# Patient Record
Sex: Female | Born: 1980 | Race: Black or African American | Hispanic: No | Marital: Married | Smoking: Never smoker
Health system: Southern US, Community
[De-identification: ages and names within clinical notes are randomized; demographics above are authoritative.]

---

## 2019-04-05 ENCOUNTER — Other Ambulatory Visit: Payer: Self-pay | Admitting: Obstetrics and Gynecology

## 2019-04-05 DIAGNOSIS — N6310 Unspecified lump in the right breast, unspecified quadrant: Secondary | ICD-10-CM

## 2019-04-22 ENCOUNTER — Other Ambulatory Visit: Payer: Self-pay

## 2019-04-22 ENCOUNTER — Ambulatory Visit
Admission: RE | Admit: 2019-04-22 | Discharge: 2019-04-22 | Disposition: A | Discharge status: Home / Self Care | Payer: BC Managed Care – PPO | Source: Ambulatory Visit | Attending: Obstetrics and Gynecology | Admitting: Obstetrics and Gynecology

## 2019-04-22 ENCOUNTER — Other Ambulatory Visit (HOSPITAL_COMMUNITY): Payer: Self-pay | Admitting: Diagnostic Radiology

## 2019-04-22 ENCOUNTER — Ambulatory Visit
Admission: RE | Admit: 2019-04-22 | Discharge: 2019-04-22 | Disposition: A | Discharge status: Home / Self Care | Payer: Medicaid Other | Source: Ambulatory Visit | Attending: Obstetrics and Gynecology | Admitting: Obstetrics and Gynecology

## 2019-04-22 DIAGNOSIS — N6310 Unspecified lump in the right breast, unspecified quadrant: Secondary | ICD-10-CM

## 2019-11-29 ENCOUNTER — Ambulatory Visit
Admission: EM | Admit: 2019-11-29 | Discharge: 2019-11-29 | Disposition: A | Discharge status: Home / Self Care | Payer: BC Managed Care – PPO | Attending: Emergency Medicine | Admitting: Emergency Medicine

## 2019-11-29 ENCOUNTER — Other Ambulatory Visit: Payer: Self-pay

## 2019-11-29 ENCOUNTER — Encounter: Payer: Self-pay | Admitting: Emergency Medicine

## 2019-11-29 DIAGNOSIS — Z20822 Contact with and (suspected) exposure to covid-19: Secondary | ICD-10-CM

## 2019-11-29 DIAGNOSIS — R110 Nausea: Secondary | ICD-10-CM | POA: Diagnosis not present

## 2019-11-29 DIAGNOSIS — R1084 Generalized abdominal pain: Secondary | ICD-10-CM

## 2019-11-29 DIAGNOSIS — K219 Gastro-esophageal reflux disease without esophagitis: Secondary | ICD-10-CM | POA: Diagnosis not present

## 2019-11-29 MED ORDER — ONDANSETRON HCL 4 MG PO TABS: 4.0000 mg | ORAL_TABLET | Freq: Four times a day (QID) | ORAL | 0 refills | Status: DC

## 2019-11-29 MED ORDER — PANTOPRAZOLE SODIUM 20 MG PO TBEC: 20.0000 mg | DELAYED_RELEASE_TABLET | Freq: Every day | ORAL | 0 refills | Status: DC

## 2019-11-29 NOTE — Discharge Instructions (Signed)
Please read food handout. Take protonix daily x 2 weeks for reflux Take zofran as needed for nausea. Important to drink plenty of water: sip throughout the day.  Your COVID test is pending - it is important to quarantine / isolate at home until your results are back. If you test positive and would like further evaluation for persistent or worsening symptoms, you may schedule an E-visit or virtual (video) visit throughout the [facility] app or website.  PLEASE NOTE: If you develop severe chest pain or shortness of breath please go to the ER or call 9-1-1 for further evaluation --> DO NOT schedule electronic or virtual visits for this. Please call our office for further guidance / recommendations as needed.

## 2019-11-29 NOTE — ED Triage Notes (Addendum)
Pt presents to [facility] for assessment of nausea, diarrhea (x3), generalized abdominal pain x 3 days.  C/o nasal congestion.  Denies dysuria, denies emesis.  C/o severe fatigue.  States she has been out of work, and now needs a note stating she does not have COVID before she can return to work.

## 2019-11-29 NOTE — ED Provider Notes (Signed)
[facility]    CSN: [id number] Arrival date & time: 11/29/19  1525      History   Chief Complaint Chief Complaint  Patient presents with  . Fatigue  . Diarrhea    HPI Jodi Wood is a 39 y.o. female presenting for 3 day course of fatigue, nausea, diarrhea.  Denies fever, known sick contacts, emesis.  Patient does have diffuse generalized abdominal pain, "like when you eat something bad".  Patient does note that her reflux is acting up: Not currently take anything for this.  Patient denies history of gallbladder issues.  Patient able to keep down water, though has not had desire to drink anything.  Endorsing bowel movements whenever she eats which is roughly 3 times daily.  No blood, melena.   History reviewed. No pertinent past medical history.  There are no problems to display for this patient.   Past Surgical History:  Procedure Laterality Date  . CESAREAN SECTION      OB History   No obstetric history on file.      Home Medications    Prior to Admission medications   Medication Sig Start Date End Date Taking? Authorizing Provider  ondansetron (ZOFRAN) 4 MG tablet Take 1 tablet (4 mg total) by mouth every 6 (six) hours. 11/29/19   Hall-Potvin, [place], PA-C  pantoprazole (PROTONIX) 20 MG tablet Take 1 tablet (20 mg total) by mouth daily. 11/29/19   Hall-Potvin, [place], PA-C    Family History Family History  Adopted: Yes    Social History Social History   Tobacco Use  . Smoking status: Never Smoker  . Smokeless tobacco: Never Used  Substance Use Topics  . Alcohol use: Not Currently    Comment: 3 x a year  . Drug use: Never     Allergies   Patient has no known allergies.   Review of Systems Review of Systems  Constitutional: Negative for activity change, appetite change, fatigue and fever.  HENT: Negative for ear pain, sinus pain, sore throat and voice change.   Eyes: Negative for pain, redness and visual disturbance.    Respiratory: Negative for cough and shortness of breath.   Cardiovascular: Negative for chest pain and palpitations.  Gastrointestinal: Positive for abdominal pain, diarrhea and nausea. Negative for abdominal distention, blood in stool, rectal pain and vomiting.       Generalized abdominal pain  Musculoskeletal: Negative for arthralgias and myalgias.  Skin: Negative for rash and wound.  Neurological: Negative for syncope and headaches.     Physical Exam Triage Vital Signs ED Triage Vitals  Enc Vitals Group     BP 11/29/19 1605 128/88     Pulse Rate 11/29/19 1605 95     Resp 11/29/19 1605 16     Temp 11/29/19 1605 97.6 F (36.4 C)     Temp Source 11/29/19 1605 Temporal     SpO2 11/29/19 1605 98 %     Weight --      Height --      Head Circumference --      Peak Flow --      Pain Score 11/29/19 1606 2     Pain Loc --      Pain Edu? --      Excl. in [place]? --    No data found.  Updated Vital Signs BP 128/88 (BP Location: Left Arm)   Pulse 95   Temp 97.6 F (36.4 C) (Temporal)   Resp 16   SpO2 98%  Visual Acuity Right Eye Distance:   Left Eye Distance:   Bilateral Distance:    Right Eye Near:   Left Eye Near:    Bilateral Near:     Physical Exam Constitutional:      General: She is not in acute distress.    Appearance: She is obese. She is not ill-appearing or diaphoretic.  HENT:     Head: Normocephalic and atraumatic.     Mouth/Throat:     Mouth: Mucous membranes are moist.     Pharynx: Oropharynx is clear. No oropharyngeal exudate or posterior oropharyngeal erythema.  Eyes:     General: No scleral icterus.    Conjunctiva/sclera: Conjunctivae normal.     Pupils: Pupils are equal, round, and reactive to light.  Cardiovascular:     Rate and Rhythm: Normal rate and regular rhythm.  Pulmonary:     Effort: Pulmonary effort is normal. No respiratory distress.     Breath sounds: No wheezing.  Abdominal:     General: Bowel sounds are normal. There is no  distension.     Tenderness: There is abdominal tenderness. There is no right CVA tenderness, left CVA tenderness, guarding or rebound.     Comments: Diffuse abdominal pain, worse at right upper quadrant.  Negative Murphy sign, McBurney sign, Rovsing sign.  Skin:    Coloration: Skin is not jaundiced or pale.  Neurological:     Mental Status: She is alert and oriented to person, place, and time.      UC Treatments / Results  Labs (all labs ordered are listed, but only abnormal results are displayed) Labs Reviewed  NOVEL CORONAVIRUS, NAA    EKG   Radiology No results found.  Procedures Procedures (including critical care time)  Medications Ordered in UC Medications - No data to display  Initial Impression / Assessment and Plan / UC Course  I have reviewed the triage vital signs and the nursing notes.  Pertinent labs & imaging results that were available during my care of the patient were reviewed by me and considered in my medical decision making (see chart for details).     Patient afebrile, nontoxic.  Able to keep down water at home.  Will use Zofran for nausea, Protonix for GERD, have patient follow-up with PCP for further evaluation/management if patient develops persistent, worsening right upper quadrant pain.  Low concern for acute cholecystitis at this time.  Patient requesting Covid testing to be able to return to work: PCR pending.  Return precautions discussed, patient verbalized understanding and is agreeable to plan. Final Clinical Impressions(s) / UC Diagnoses   Final diagnoses:  Generalized abdominal pain  Nausea without vomiting  Gastroesophageal reflux disease, unspecified whether esophagitis present     Discharge Instructions     Please read food handout. Take protonix daily x 2 weeks for reflux Take zofran as needed for nausea. Important to drink plenty of water: sip throughout the day.  Your COVID test is pending - it is important to quarantine /  isolate at home until your results are back. If you test positive and would like further evaluation for persistent or worsening symptoms, you may schedule an E-visit or virtual (video) visit throughout the [facility] app or website.  PLEASE NOTE: If you develop severe chest pain or shortness of breath please go to the ER or call 9-1-1 for further evaluation --> DO NOT schedule electronic or virtual visits for this. Please call our office for further guidance / recommendations as needed.  ED Prescriptions    Medication Sig Dispense Auth. Provider   ondansetron (ZOFRAN) 4 MG tablet Take 1 tablet (4 mg total) by mouth every 6 (six) hours. 12 tablet Hall-Potvin, [place], PA-C   pantoprazole (PROTONIX) 20 MG tablet Take 1 tablet (20 mg total) by mouth daily. 30 tablet Hall-Potvin, [place], PA-C     PDMP not reviewed this encounter.   Hall-Potvin, [place], [place] 11/29/19 1630

## 2019-11-30 LAB — NOVEL CORONAVIRUS, NAA: SARS-CoV-2, NAA: NOT DETECTED

## 2020-02-21 ENCOUNTER — Encounter: Payer: Self-pay | Admitting: Emergency Medicine

## 2020-02-21 ENCOUNTER — Ambulatory Visit
Admission: EM | Admit: 2020-02-21 | Discharge: 2020-02-21 | Disposition: A | Discharge status: Home / Self Care | Payer: BC Managed Care – PPO | Attending: Emergency Medicine | Admitting: Emergency Medicine

## 2020-02-21 ENCOUNTER — Other Ambulatory Visit: Payer: Self-pay

## 2020-02-21 DIAGNOSIS — H43393 Other vitreous opacities, bilateral: Secondary | ICD-10-CM | POA: Diagnosis not present

## 2020-02-21 DIAGNOSIS — R519 Headache, unspecified: Secondary | ICD-10-CM | POA: Diagnosis not present

## 2020-02-21 MED ORDER — NAPROXEN 500 MG PO TABS: 500.0000 mg | ORAL_TABLET | Freq: Two times a day (BID) | ORAL | 0 refills | Status: DC

## 2020-02-21 NOTE — Discharge Instructions (Signed)
Keep headache log to review with PCP. Important to establish care for further evaluation and health maintenance. Go to ER for worsening headache, worst headache of life, change in vision or hearing, numbness, tingling, vomiting.

## 2020-02-21 NOTE — ED Provider Notes (Signed)
[facility]    CSN: [id number] Arrival date & time: 02/21/20  1321      History   Chief Complaint Chief Complaint  Patient presents with  . Headache    HPI Jodi Wood is a 39 y.o. female with history of COVID-19 infection presenting for intermittent severe headaches.  States that she was diagnosed with Covid 02/04/2020, developed symptoms 3/16.  Patient having improvement in taste, though still does not have smell.  Patient states that head pain was right-sided, vibratory, and intermittent, though has become more frequent.  States that sometimes it will feel throbbing.  States it is "all over "her head.  Patient states that sometimes she will feel lightheaded.  Pain appears to be random, though consistent with exertion.  Patient endorsing syncopal event, change in hearing, tinnitus, nausea, vomiting.  No numbness, weakness.  Patient denies wearing glasses or contact lenses.  Does endorse change in vision, though has hard time articulating what is different.  Patient does note floaters bilaterally for the last 2 days.  Denies change in visual field, sweeping dark curtain, bright flashes of light.   History reviewed. No pertinent past medical history.  There are no problems to display for this patient.   Past Surgical History:  Procedure Laterality Date  . CESAREAN SECTION      OB History   No obstetric history on file.      Home Medications    Prior to Admission medications   Medication Sig Start Date End Date Taking? Authorizing Provider  naproxen (NAPROSYN) 500 MG tablet Take 1 tablet (500 mg total) by mouth 2 (two) times daily. 02/21/20   Hall-Potvin, [place], PA-C  pantoprazole (PROTONIX) 20 MG tablet Take 1 tablet (20 mg total) by mouth daily. Patient not taking: Reported on 02/21/2020 11/29/19 02/21/20  Hall-Potvin, [place], PA-C    Family History Family History  Adopted: Yes    Social History Social History   Tobacco Use  . Smoking status:  Never Smoker  . Smokeless tobacco: Never Used  Substance Use Topics  . Alcohol use: Not Currently    Comment: 3 x a year  . Drug use: Never     Allergies   Patient has no known allergies.   Review of Systems As per HPI   Physical Exam Triage Vital Signs ED Triage Vitals  Enc Vitals Group     BP      Pulse      Resp      Temp      Temp src      SpO2      Weight      Height      Head Circumference      Peak Flow      Pain Score      Pain Loc      Pain Edu?      Excl. in [place]?    No data found.  Updated Vital Signs BP (!) 157/81 (BP Location: Left Arm)   Pulse (!) 103   Temp 98.8 F (37.1 C) (Oral)   Resp 18   SpO2 99%   Visual Acuity Right Eye Distance:   Left Eye Distance:   Bilateral Distance:    Right Eye Near: R Near: 20/20 Left Eye Near:  L Near: 20/20 Bilateral Near:  20/20  Physical Exam Constitutional:      General: She is not in acute distress.    Appearance: She is well-developed. She is not ill-appearing.  HENT:  Head: Normocephalic and atraumatic.     Right Ear: Tympanic membrane, ear canal and external ear normal.     Left Ear: Tympanic membrane, ear canal and external ear normal.     Mouth/Throat:     Mouth: Mucous membranes are moist.     Pharynx: Oropharynx is clear.  Eyes:     General: No visual field deficit or scleral icterus.    Extraocular Movements: Extraocular movements intact.     Right eye: No nystagmus.     Left eye: No nystagmus.     Conjunctiva/sclera: Conjunctivae normal.     Pupils: Pupils are equal, round, and reactive to light.  Cardiovascular:     Rate and Rhythm: Normal rate and regular rhythm.  Pulmonary:     Effort: Pulmonary effort is normal. No respiratory distress.     Breath sounds: No wheezing.  Musculoskeletal:        General: No deformity. Normal range of motion.     Cervical back: Normal range of motion. No rigidity or tenderness.  Lymphadenopathy:     Cervical: No cervical adenopathy.   Skin:    Capillary Refill: Capillary refill takes less than 2 seconds.     Coloration: Skin is not jaundiced.     Findings: No bruising or rash.  Neurological:     Mental Status: She is alert and oriented to person, place, and time.     Cranial Nerves: Cranial nerves are intact. No cranial nerve deficit, dysarthria or facial asymmetry.     Sensory: Sensation is intact. No sensory deficit.     Motor: Motor function is intact. No weakness.     Coordination: Coordination is intact. Romberg sign negative. Coordination normal.     Gait: Gait is intact. Gait normal.     Deep Tendon Reflexes: Reflexes normal.  Psychiatric:        Mood and Affect: Mood is anxious. Mood is not depressed.        Speech: Speech normal.        Behavior: Behavior normal. Behavior is not agitated.        Cognition and Memory: Cognition is not impaired. Memory is not impaired.     Comments: Patient tearful at times, though consolable.  States she is exhausted.      UC Treatments / Results  Labs (all labs ordered are listed, but only abnormal results are displayed) Labs Reviewed - No data to display  EKG   Radiology No results found.  Procedures Procedures (including critical care time)  Medications Ordered in UC Medications - No data to display  Initial Impression / Assessment and Plan / UC Course  I have reviewed the triage vital signs and the nursing notes.  Pertinent labs & imaging results that were available during my care of the patient were reviewed by me and considered in my medical decision making (see chart for details).     Patient afebrile, nontoxic, and without neurocognitive deficit.  Unsure of etiology of head pain, though question if it is vascular s/p acute COVID-19 infection.  Patient did not PCP to follow-up with.  Reviewed ER return precautions, keeping symptom log, will trial naproxen to see if this is helpful.  Offered work note: Patient declined.  More concerning is sudden onset  of bilateral floaters x2 days.  This provider coordinated care: Patient to present to ophthalmology office straight from urgent care for further evaluation/management.  Patient verbalized understanding of location of office, intends to go straight there.  Return precautions  discussed, patient verbalized understanding and is agreeable to plan. Final Clinical Impressions(s) / UC Diagnoses   Final diagnoses:  Floaters in visual field, bilateral  Nonintractable headache, unspecified chronicity pattern, unspecified headache type     Discharge Instructions     Keep headache log to review with PCP. Important to establish care for further evaluation and health maintenance. Go to ER for worsening headache, worst headache of life, change in vision or hearing, numbness, tingling, vomiting.    ED Prescriptions    Medication Sig Dispense Auth. Provider   naproxen (NAPROSYN) 500 MG tablet Take 1 tablet (500 mg total) by mouth 2 (two) times daily. 30 tablet Hall-Potvin, [place], PA-C     PDMP not reviewed this encounter.   Hall-Potvin, [place], [place] 02/21/20 1427

## 2020-02-21 NOTE — ED Triage Notes (Signed)
Pt here for severe head pain that comes and goes since being diagnosed with covid on 3/20; pt sts worse with activity and she feels lightheaded with pain

## 2021-03-14 ENCOUNTER — Encounter (HOSPITAL_COMMUNITY): Payer: Self-pay

## 2021-03-14 ENCOUNTER — Observation Stay (HOSPITAL_COMMUNITY)
Admission: EM | Admit: 2021-03-14 | Discharge: 2021-03-15 | Disposition: A | Discharge status: Home / Self Care | Payer: BLUE CROSS/BLUE SHIELD | Attending: Internal Medicine | Admitting: Internal Medicine

## 2021-03-14 ENCOUNTER — Other Ambulatory Visit: Payer: Self-pay

## 2021-03-14 ENCOUNTER — Emergency Department (HOSPITAL_COMMUNITY): Payer: BLUE CROSS/BLUE SHIELD

## 2021-03-14 DIAGNOSIS — H543 Unqualified visual loss, both eyes: Secondary | ICD-10-CM | POA: Diagnosis present

## 2021-03-14 DIAGNOSIS — Y900 Blood alcohol level of less than 20 mg/100 ml: Secondary | ICD-10-CM | POA: Diagnosis not present

## 2021-03-14 DIAGNOSIS — G43809 Other migraine, not intractable, without status migrainosus: Principal | ICD-10-CM | POA: Insufficient documentation

## 2021-03-14 DIAGNOSIS — Z20822 Contact with and (suspected) exposure to covid-19: Secondary | ICD-10-CM | POA: Diagnosis not present

## 2021-03-14 DIAGNOSIS — G459 Transient cerebral ischemic attack, unspecified: Secondary | ICD-10-CM

## 2021-03-14 DIAGNOSIS — D649 Anemia, unspecified: Secondary | ICD-10-CM | POA: Diagnosis present

## 2021-03-14 DIAGNOSIS — H547 Unspecified visual loss: Secondary | ICD-10-CM

## 2021-03-14 DIAGNOSIS — H5347 Heteronymous bilateral field defects: Secondary | ICD-10-CM | POA: Diagnosis present

## 2021-03-14 LAB — RAPID URINE DRUG SCREEN, HOSP PERFORMED
Amphetamines: NOT DETECTED
Barbiturates: NOT DETECTED
Benzodiazepines: NOT DETECTED
Cocaine: NOT DETECTED
Opiates: NOT DETECTED
Tetrahydrocannabinol: NOT DETECTED

## 2021-03-14 LAB — DIFFERENTIAL
Abs Immature Granulocytes: 0.02 10*3/uL (ref 0.00–0.07)
Basophils Absolute: 0 10*3/uL (ref 0.0–0.1)
Basophils Relative: 0 %
Eosinophils Absolute: 0.2 10*3/uL (ref 0.0–0.5)
Eosinophils Relative: 3 %
Immature Granulocytes: 0 %
Lymphocytes Relative: 47 %
Lymphs Abs: 3.1 10*3/uL (ref 0.7–4.0)
Monocytes Absolute: 0.6 10*3/uL (ref 0.1–1.0)
Monocytes Relative: 9 %
Neutro Abs: 2.7 10*3/uL (ref 1.7–7.7)
Neutrophils Relative %: 41 %

## 2021-03-14 LAB — CBC
HCT: 31.1 % — ABNORMAL LOW (ref 36.0–46.0)
Hemoglobin: 9.2 g/dL — ABNORMAL LOW (ref 12.0–15.0)
MCH: 17 pg — ABNORMAL LOW (ref 26.0–34.0)
MCHC: 29.6 g/dL — ABNORMAL LOW (ref 30.0–36.0)
MCV: 57.4 fL — ABNORMAL LOW (ref 80.0–100.0)
Platelets: 437 10*3/uL — ABNORMAL HIGH (ref 150–400)
RBC: 5.42 MIL/uL — ABNORMAL HIGH (ref 3.87–5.11)
RDW: 21.1 % — ABNORMAL HIGH (ref 11.5–15.5)
WBC: 6.7 10*3/uL (ref 4.0–10.5)
nRBC: 0 % (ref 0.0–0.2)

## 2021-03-14 LAB — COMPREHENSIVE METABOLIC PANEL
ALT: 16 U/L (ref 0–44)
AST: 19 U/L (ref 15–41)
Albumin: 4.5 g/dL (ref 3.5–5.0)
Alkaline Phosphatase: 59 U/L (ref 38–126)
Anion gap: 12 (ref 5–15)
BUN: 8 mg/dL (ref 6–20)
CO2: 21 mmol/L — ABNORMAL LOW (ref 22–32)
Calcium: 9.4 mg/dL (ref 8.9–10.3)
Chloride: 105 mmol/L (ref 98–111)
Creatinine, Ser: 0.79 mg/dL (ref 0.44–1.00)
GFR, Estimated: >60 mL/min (ref >60)
Glucose, Bld: 95 mg/dL (ref 70–99)
Potassium: 3.4 mmol/L — ABNORMAL LOW (ref 3.5–5.1)
Sodium: 138 mmol/L (ref 135–145)
Total Bilirubin: 0.3 mg/dL (ref 0.3–1.2)
Total Protein: 7.8 g/dL (ref 6.5–8.1)

## 2021-03-14 LAB — ETHANOL: Alcohol, Ethyl (B): <10 mg/dL (ref <10)

## 2021-03-14 LAB — URINALYSIS, ROUTINE W REFLEX MICROSCOPIC
Bilirubin Urine: NEGATIVE
Glucose, UA: NEGATIVE mg/dL
Ketones, ur: NEGATIVE mg/dL
Leukocytes,Ua: NEGATIVE
Nitrite: NEGATIVE
Protein, ur: NEGATIVE mg/dL
Specific Gravity, Urine: 1.005 (ref 1.005–1.030)
pH: 6 (ref 5.0–8.0)

## 2021-03-14 LAB — APTT: aPTT: 31 seconds (ref 24–36)

## 2021-03-14 LAB — RESP PANEL BY RT-PCR (FLU A&B, COVID) ARPGX2
Influenza A by PCR: NEGATIVE
Influenza B by PCR: NEGATIVE
SARS Coronavirus 2 by RT PCR: NEGATIVE

## 2021-03-14 LAB — PROTIME-INR
INR: 0.9 (ref 0.8–1.2)
Prothrombin Time: 12.5 seconds (ref 11.4–15.2)

## 2021-03-14 LAB — HCG, QUANTITATIVE, PREGNANCY: hCG, Beta Chain, Quant, S: <1 m[IU]/mL (ref <5)

## 2021-03-14 MED ORDER — KETOROLAC TROMETHAMINE 30 MG/ML IJ SOLN
15.0000 mg | Freq: Once | INTRAMUSCULAR | Status: AC
  Administered 2021-03-14: 15 mg via INTRAVENOUS
  Filled 2021-03-14: qty 1

## 2021-03-14 NOTE — ED Triage Notes (Signed)
Pt reports left sided vision loss while driving within the last hour. Vision is back to normal now. Pt denies confusion and unilateral weakness. Pt has equal grips, strength and sensation in upper extremities. Left leg seem slightly weaker than the right.

## 2021-03-14 NOTE — ED Provider Notes (Signed)
[facility]-EMERGENCY DEPT Provider Note   CSN: [id number] Arrival date & time: 03/14/21  2015     History Chief Complaint  Patient presents with  . Loss of Vision    Jodi Wood is a 40 y.o. female.  HPI She presents for evaluation of transient vision loss in both eyes, left-sided, associated with right-sided headache which started about 6:30 PM as she was driving home from work.  She states that the headache was initially 7/10 and now is 3/10.  The patient has returned to normal.  Patient was lasted about 45 minutes.  No prior similar problems.  She denies history of migraines.  She denies recent illnesses.  There are no other known modifying factors.    History reviewed. No pertinent past medical history.  There are no problems to display for this patient.   Past Surgical History:  Procedure Laterality Date  . CESAREAN SECTION       OB History   No obstetric history on file.     Family History  Adopted: Yes    Social History   Tobacco Use  . Smoking status: Never Smoker  . Smokeless tobacco: Never Used  Substance Use Topics  . Alcohol use: Not Currently    Comment: 3 x a year  . Drug use: Never    Home Medications Prior to Admission medications   Medication Sig Start Date End Date Taking? Authorizing Provider  naproxen (NAPROSYN) 500 MG tablet Take 1 tablet (500 mg total) by mouth 2 (two) times daily. 02/21/20   Hall-Potvin, [place], PA-C  pantoprazole (PROTONIX) 20 MG tablet Take 1 tablet (20 mg total) by mouth daily. Patient not taking: Reported on 02/21/2020 11/29/19 02/21/20  Hall-Potvin, [place], PA-C    Allergies    Patient has no known allergies.  Review of Systems   Review of Systems  All other systems reviewed and are negative.   Physical Exam Updated Vital Signs BP (!) 154/79 (BP Location: Left Arm)   Pulse 94   Temp 97.9 F (36.6 C) (Oral)   Resp 20   Ht 5' (1.524 m)   Wt 79.4 kg   SpO2 100%   BMI 34.18  kg/m   Physical Exam Vitals and nursing note reviewed.  Constitutional:      General: She is not in acute distress.    Appearance: She is well-developed. She is not ill-appearing, toxic-appearing or diaphoretic.  HENT:     Head: Normocephalic and atraumatic.     Right Ear: External ear normal.     Left Ear: External ear normal.  Eyes:     Conjunctiva/sclera: Conjunctivae normal.     Pupils: Pupils are equal, round, and reactive to light.  Neck:     Trachea: Phonation normal.  Cardiovascular:     Rate and Rhythm: Normal rate and regular rhythm.     Heart sounds: Normal heart sounds.  Pulmonary:     Effort: Pulmonary effort is normal. No respiratory distress.     Breath sounds: No stridor.  Abdominal:     Tenderness: There is no abdominal tenderness.  Musculoskeletal:        General: Normal range of motion.     Cervical back: Normal range of motion and neck supple.  Skin:    General: Skin is warm and dry.  Neurological:     Mental Status: She is alert and oriented to person, place, and time.     Cranial Nerves: No cranial nerve deficit.  Sensory: No sensory deficit.     Motor: No abnormal muscle tone.     Coordination: Coordination normal.  Psychiatric:        Behavior: Behavior normal.        Thought Content: Thought content normal.        Judgment: Judgment normal.     ED Results / Procedures / Treatments   Labs (all labs ordered are listed, but only abnormal results are displayed) Labs Reviewed - No data to display  EKG None  Radiology No results found.  Procedures .Critical Care Performed by: Mancel Bale, MD Authorized by: Mancel Bale, MD   Critical care provider statement:    Critical care time (minutes):  45   Critical care start time:  03/14/2021 8:35 PM   Critical care end time:  03/14/2021 11:46 PM   Critical care time was exclusive of:  Separately billable procedures and treating other patients   Critical care was necessary to treat or  prevent imminent or life-threatening deterioration of the following conditions:  CNS failure or compromise   Critical care was time spent personally by me on the following activities:  Blood draw for specimens, development of treatment plan with patient or surrogate, discussions with consultants, evaluation of patient's response to treatment, examination of patient, obtaining history from patient or surrogate, ordering and performing treatments and interventions, ordering and review of laboratory studies, pulse oximetry, re-evaluation of patient's condition, review of old charts and ordering and review of radiographic studies     Medications Ordered in ED Medications - No data to display  ED Course  I have reviewed the triage vital signs and the nursing notes.  Pertinent labs & imaging results that were available during my care of the patient were reviewed by me and considered in my medical decision making (see chart for details).  Clinical Course as of 03/14/21 2343  Thu Mar 14, 2021  2050 Patient meets criteria for code stroke, transient vision loss left visual field, both eyes, onset 6:30 PM tonight.  Vision loss resolved, headache persists. [EW]    Clinical Course User Index [EW] Mancel Bale, MD   MDM Rules/Calculators/A&P                           Patient Vitals for the past 24 hrs:  BP Temp Temp src Pulse Resp SpO2 Height Weight  03/14/21 2035 (!) 154/79 97.9 F (36.6 C) Oral 94 20 100 % -- --  03/14/21 2019 (!) 141/81 98.4 F (36.9 C) Oral 87 16 100 % 5' (1.524 m) 79.4 kg    11:13 PM Reevaluation with update and discussion. After initial assessment and treatment, an updated evaluation reveals her headache is resolved.  Findings discussed with the patient.Mancel Bale   Medical Decision Making:  This patient is presenting for evaluation of sudden vision loss, left hemianopia, which is require a range of treatment options, and is a complaint that involves a high risk of  morbidity and mortality. The differential diagnoses include CVA, migraine, intraocular disorders. I decided to review old records, and in summary healthy adult female presenting with sudden vision loss, which was transient with persistent headache..  I did not require additional historical information from anyone.  Clinical Laboratory Tests Ordered, included CBC, Metabolic panel and Urinalysis.  Pregnancy test, viral panel, UDS.  Review indicates normal except hemoglobin low, few bacteria in urine, potassium slightly low. Radiologic Tests Ordered, included CT head,.  I independently Visualized:  Radiographic images, which show no acute abnormality  Cardiac Monitor Tracing which shows normal sinus rhythm   Critical Interventions-clinical evaluation, code stroke designation, laboratory testing, imaging, discussion with neuro hospitalist, observation and reassessment  After These Interventions, the Patient was reevaluated and was found with resolution of headache after treatment with Toradol.  Patient with acute onset of symptoms, with differential diagnosis including migraine and CVA.  No evidence for intracranial bleeding.  Headache improved with symptomatic treatment.  Patient is low risk patient for stroke.  Patient requires diagnostic testing with MRI.  Neuro hospitalist has entered recommendations for evaluation which are typical stroke work-up.  Patient agreeable to staying.  CRITICAL CARE-yes Performed by: Mancel Bale  Nursing Notes Reviewed/ Care Coordinated Applicable Imaging Reviewed Interpretation of Laboratory Data incorporated into ED treatment  11:43 PM-Consult complete with hospitalist. Patient case explained and discussed.  She agrees to admit patient for further evaluation and treatment. Call ended at 11:57 PM    Final Clinical Impression(s) / ED Diagnoses Final diagnoses:  Other migraine without status migrainosus, not intractable  Anemia, unspecified type    Rx / DC  Orders ED Discharge Orders    None       Mancel Bale, MD 03/14/21 2359

## 2021-03-14 NOTE — H&P (Signed)
Jodi Wood ZOX:[id number] DOB: Apr 17, 1981 DOA: 03/14/2021     PCP: Patient, No Pcp Per (Inactive)   Outpatient Specialists: NONE    Patient arrived to ER on 03/14/21 at 2015 Referred by Attending Mancel Bale, MD   Patient coming from: home Lives   With family    Chief Complaint:  Chief Complaint  Patient presents with  . Loss of Vision    HPI: Jodi Wood is a 40 y.o. female with medical history significant of anemia    Presented with left-sided vision loss today while driving happened around 811 PM but returned back to normal.  Patient was driving home initially she had a headache in the back of her head then she noted to have a cervical in front of her with everything was blurry followed by blurry vision on the left only followed by complete visual loss on the left and then see a year headache involving right side of her head and right face no loss of sensation in the face.  No nausea or vomiting no photophobia no loss of sensation or strength in arms or legs.  She could not look in the mirror to see if she had facial droop.    Lasted for 45 minutes.  And then resolved no history of prior migraines. No prior history of strokes Patient adopted does not know her family history.   Reports known history of heavy menstrual bleeding and anemia for which she takes iron supplements but not always able to take them secondary to causing GI distress  Initial COVID TEST  NEGATIVE   Lab Results  Component Value Date   SARSCOV2NAA NEGATIVE 03/14/2021   SARSCOV2NAA Not Detected 11/29/2019    Regarding pertinent Chronic problems:  Chronic anemia - baseline hg Hemoglobin & Hematocrit  Recent Labs    03/14/21 2045  HGB 9.2*    While in ER: Initially code stroke was initiated CT head nonacute Was seen in consult by neurology tele specialist recommended headache cocktail and MRI neurology was to follow-up    ED Triage Vitals  Enc Vitals Group     BP 03/14/21  2019 (!) 141/81     Pulse Rate 03/14/21 2019 87     Resp 03/14/21 2019 16     Temp 03/14/21 2019 98.4 F (36.9 C)     Temp Source 03/14/21 2019 Oral     SpO2 03/14/21 2019 100 %     Weight 03/14/21 2019 175 lb (79.4 kg)     Height 03/14/21 2019 5' (1.524 m)     Head Circumference --      Peak Flow --      Pain Score 03/14/21 2031 8     Pain Loc --      Pain Edu? --      Excl. in GC? --   TMAX(24)@     _________________________________________ Significant initial  Findings: Abnormal Labs Reviewed  CBC - Abnormal; Notable for the following components:      Result Value   RBC 5.42 (*)    Hemoglobin 9.2 (*)    HCT 31.1 (*)    MCV 57.4 (*)    MCH 17.0 (*)    MCHC 29.6 (*)    RDW 21.1 (*)    Platelets 437 (*)    All other components within normal limits  COMPREHENSIVE METABOLIC PANEL - Abnormal; Notable for the following components:   Potassium 3.4 (*)    CO2 21 (*)    All other  components within normal limits  URINALYSIS, ROUTINE W REFLEX MICROSCOPIC - Abnormal; Notable for the following components:   Color, Urine STRAW (*)    Hgb urine dipstick SMALL (*)    Bacteria, UA FEW (*)    All other components within normal limits   ____________________________________________  CT HEAD   NON acute  CXR - Ordered    ECG: Ordered Personally reviewed by me showing: HR : 78 Rhythm: NSR,    , no evidence of ischemic changes QTC 442   The recent clinical data is shown below. Vitals:   03/14/21 2200 03/14/21 2230 03/14/21 2300 03/14/21 2330  BP: 111/73 113/73 121/82 112/71  Pulse: 71 71 71 78  Resp: Temp:      TempSrc:      SpO2: 100% 100% 100% 100%  Weight:      Height:       WBC     Component Value Date/Time   WBC 6.7 03/14/2021 2045   LYMPHSABS 3.1 03/14/2021 2045   MONOABS 0.6 03/14/2021 2045   EOSABS 0.2 03/14/2021 2045   BASOSABS 0.0 03/14/2021 2045     UA   no evidence of UTI    Urine analysis:    Component Value Date/Time   COLORURINE  STRAW (A) 03/14/2021 2144   APPEARANCEUR CLEAR 03/14/2021 2144   LABSPEC 1.005 03/14/2021 2144   PHURINE 6.0 03/14/2021 2144   GLUCOSEU NEGATIVE 03/14/2021 2144   HGBUR SMALL (A) 03/14/2021 2144   BILIRUBINUR NEGATIVE 03/14/2021 2144   KETONESUR NEGATIVE 03/14/2021 2144   PROTEINUR NEGATIVE 03/14/2021 2144   NITRITE NEGATIVE 03/14/2021 2144   LEUKOCYTESUR NEGATIVE 03/14/2021 2144    Results for orders placed or performed during the hospital encounter of 03/14/21  Resp Panel by RT-PCR (Flu A&B, Covid) Nasopharyngeal Swab     Status: None   Collection Time: 03/14/21  8:45 PM   Specimen: Nasopharyngeal Swab; Nasopharyngeal(NP) swabs in vial transport medium  Result Value Ref Range Status   SARS Coronavirus 2 by RT PCR NEGATIVE NEGATIVE Final         Influenza A by PCR NEGATIVE NEGATIVE Final   Influenza B by PCR NEGATIVE NEGATIVE Final          _______________________________________________________ ER Provider Called: Tele  neurology    Dr. Cheral Bay They Recommend admit to medicine  MRI and stroke work up Will see in AM   _______________________________________________ Hospitalist was called for admission for transient loss of vision on left side  The following Work up has been ordered so far:  Orders Placed This Encounter  Procedures  . Critical Care  . Resp Panel by RT-PCR (Flu A&B, Covid) Nasopharyngeal Swab  . CT HEAD CODE STROKE WO CONTRAST  . Ethanol  . Protime-INR  . APTT  . CBC  . Differential  . Comprehensive metabolic panel  . Urine rapid drug screen (hosp performed)  . Urinalysis, Routine w reflex microscopic  . hCG, quantitative, pregnancy  . Diet NPO time specified  . Vital signs  . Cardiac Monitoring  . Modified Stroke Scale (mNIHSS) Document mNIHSS assessment every 2 hours for a total of 12 hours  . Stroke swallow screen  . Initiate Carrier Fluid Protocol  . If O2 sat If O2 Sat < 94%, administer O2 at 2 liters/minute via nasal cannula.  [place] to  Activate Code Stroke Call [facility] [phone]  . Consult to hospitalist  . Pulse oximetry, continuous  . I-stat chem 8, ED  . I-Stat  beta hCG blood, ED  . ED EKG  . EKG 12-Lead  . Saline lock IV    Following Medications were ordered in ER: Medications  ketorolac (TORADOL) 30 MG/ML injection 15 mg (15 mg Intravenous Given 03/14/21 2135)        Consult Orders  (From admission, onward)         Start     Ordered   03/14/21 2341  Consult to hospitalist  Once       Provider:  (Not yet assigned)  Question Answer Comment  Place call to: [facility]   Reason for Consult Admit      03/14/21 2340            OTHER Significant initial  Findings:  labs showing:    Recent Labs  Lab 03/14/21 2045  NA 138  K 3.4*  CO2 21*  GLUCOSE 95  BUN 8  CREATININE 0.79  CALCIUM 9.4    Cr  Stable  Lab Results  Component Value Date   CREATININE 0.79 03/14/2021    Recent Labs  Lab 03/14/21 2045  AST 19  ALT 16  ALKPHOS 59  BILITOT 0.3  PROT 7.8  ALBUMIN 4.5   Lab Results  Component Value Date   CALCIUM 9.4 03/14/2021          Plt: Lab Results  Component Value Date   PLT 437 (H) 03/14/2021    Recent Labs  Lab 03/14/21 2045  WBC 6.7  NEUTROABS 2.7  HGB 9.2*  HCT 31.1*  MCV 57.4*  PLT 437*    HG/HCT     Component Value Date/Time   HGB 9.2 (L) 03/14/2021 2045   HCT 31.1 (L) 03/14/2021 2045   MCV 57.4 (L) 03/14/2021 2045    Radiological Exams on Admission: CT HEAD CODE STROKE WO CONTRAST  Result Date: 03/14/2021 CLINICAL DATA:  Code stroke. Initial evaluation for acute left-sided visual loss. EXAM: CT HEAD WITHOUT CONTRAST TECHNIQUE: Contiguous axial images were obtained from the base of the skull through the vertex without intravenous contrast. COMPARISON:  None. FINDINGS: Brain: Cerebral volume within normal limits for patient age. No evidence for acute intracranial hemorrhage. No findings to suggest acute large vessel territory infarct. No mass  lesion, midline shift, or mass effect. Ventricles are normal in size without evidence for hydrocephalus. No extra-axial fluid collection identified. 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 Vascular: No hyperdense vessel identified. Skull: Scalp soft tissues demonstrate no acute abnormality. Calvarium intact. Sinuses/Orbits: Globes and orbital soft tissues within normal limits. Visualized paranasal sinuses are clear. No mastoid effusion. ASPECTS [facility] Stroke Program Early CT Score) - Ganglionic level infarction (caudate, lentiform nuclei, internal capsule, insula, M1-M3 cortex): 7 - Supraganglionic infarction (M4-M6 cortex): 3 Total score (0-10 with 10 being normal): 10 IMPRESSION: 1. No acute intracranial abnormality. 2. ASPECTS is 10. 3. Chiari 1 malformation. Critical Value/emergent results were called by telephone at the time of interpretation on 03/14/2021 at 9:11 pm to provider [facility] , who verbally acknowledged these results. Electronically Signed   By: Rise Mu M.D.   On: 03/14/2021 21:11   _______________________________________________________________________________________________________ Latest  Blood pressure 112/71, pulse 78, temperature 97.9 F (36.6 C), temperature source Oral, resp. rate 18, height 5' (1.524 m), weight 79.4 kg, SpO2 100 %.   Review of Systems:    Pertinent positives include: Loss of vision left eye and a headache  Constitutional:  No weight loss, night sweats, Fevers, chills, fatigue, weight loss  HEENT:   , Difficulty swallowing,Tooth/dental problems,Sore throat,  No sneezing, itching, ear ache, nasal congestion, post nasal drip,  Cardio-vascular:  No chest pain, Orthopnea, PND, anasarca, dizziness, palpitations.no Bilateral lower extremity swelling  GI:  No heartburn, indigestion, abdominal pain, nausea, vomiting, diarrhea, change in bowel habits, loss of appetite, melena, blood in stool,  hematemesis Resp:  no shortness of breath at rest. No dyspnea on exertion, No excess mucus, no productive cough, No non-productive cough, No coughing up of blood.No change in color of mucus.No wheezing. Skin:  no rash or lesions. No jaundice GU:  no dysuria, change in color of urine, no urgency or frequency. No straining to urinate.  No flank pain.  Musculoskeletal:  No joint pain or no joint swelling. No decreased range of motion. No back pain.  Psych:  No change in mood or affect. No depression or anxiety. No memory loss.  Neuro: , no tingling, no weakness, no double vision, no gait abnormality, no slurred speech, no confusion  All systems reviewed and apart from HOPI all are negative _______________________________________________________________________________________________ Past Medical History:  History reviewed. No pertinent past medical history.    Past Surgical History:  Procedure Laterality Date  . CESAREAN SECTION      Social History:  Ambulatory  independently       reports that she has never smoked. She has never used smokeless tobacco. She reports previous alcohol use. She reports that she does not use drugs.    Family History:   Family History  Adopted: Yes   ______________________________________________________________________________________________ Allergies: No Known Allergies   Prior to Admission medications   Medication Sig Start Date End Date Taking? Authorizing Provider  ibuprofen (ADVIL) 200 MG tablet Take 400 mg by mouth every 6 (six) hours as needed for mild pain.   Yes [provider]  Multiple Vitamin (MULTIVITAMIN WITH MINERALS) TABS tablet Take 1 tablet by mouth daily.   Yes [provider]  naproxen (NAPROSYN) 500 MG tablet Take 1 tablet (500 mg total) by mouth 2 (two) times daily. Patient not taking: No sig reported 02/21/20   Hall-Potvin, [place], PA-C  pantoprazole (PROTONIX) 20 MG tablet Take 1 tablet (20 mg total)  by mouth daily. Patient not taking: Reported on 02/21/2020 11/29/19 02/21/20  Hall-Potvin, [place], PA-C    ___________________________________________________________________________________________________ Physical Exam: Vitals with BMI 03/14/2021 03/14/2021 03/14/2021  Height - - -  Weight - - -  BMI - - -  Systolic 112 121 250  Diastolic 71 82 73  Pulse 78 71 71     1. General:  in No  Acute distress   well  -appearing 2. Psychological: Alert and   Oriented 3. Head/ENT:      Dry Mucous Membranes                          Head Non traumatic, neck supple                           Poor Dentition 4. SKIN:  decreased Skin turgor,  Skin clean Dry and intact no rash 5. Heart: Regular rate and rhythm no  Murmur, no Rub or gallop 6. Lungs:   no wheezes or crackles   7. Abdomen: Soft,  non-tender, Non distended  bowel sounds present 8. Lower extremities: no clubbing, cyanosis, no edema 9. Neurologically strength 5 out of 5 in all 4 extremities cranial nerves II through XII intact 10. MSK: Normal  range of motion    Chart has been reviewed  ______________________________________________________________________________________________  Assessment/Plan  40 y.o. female with medical history significant of anemia    Admitted for left side visual loss possible TIA versus migraine  Present on Admission:  . Hemianopsia -differential diagnosis includes TIA/stroke versus migraine.  Appreciate neurology consult.  Currently headache resolved.  - will admit based on TIA/CVA protocol,        Monitor on Tele       MRA/MRI await  results           Carotid Doppler        Echo to evaluate for possible embolic source,        obtain cardiac enzymes,  ECG,   Lipid panel, TSH.        Order PT/OT evaluation.        keep nothing by mouth until passes swallow eval   when does restart regular diet              Allow permissive Hypertension keep BP <220/120        Neurology consulted Have seen pt will see  in AM   . Anemia - - Most likely cause of Anemia  heavy menses -Obtain anemia panel - Hemoccult stool 3 - Check TSH Patient has been taking over-the-counter iron supplements but has not been compliant encourage compliance will need to follow-up with OB/GYN   Other plan as per orders.  DVT prophylaxis:  SCD   Code Status:    Code Status: Not on file FULL CODE  as per patient   I had personally discussed CODE STATUS with patient    Family Communication:   Family not at  Bedside    Disposition Plan:        To home once workup is complete and patient is stable   Following barriers for discharge:                            TIA/Stroke work up completed                           Will need consultants to evaluate patient prior to discharge                                        Consults called: neurology   Admission status:  ED Disposition    ED Disposition Condition Comment   Admit  Hospital Area: [facility] [[id number]]  Level of Care: Telemetry [5]  Admit to tele based on following criteria: Other see comments  Comments: tia  Covid Evaluation: Confirmed COVID Negative  Diagnosis: Loss of vision [[id number]]  Admitting Physician: Therisa Doyne [[id number]]  Attending Physician: Therisa Doyne [[id number]]       Obs   Level of care   tele   indefinitely please discontinue once patient no longer qualifies COVID-19 Labs    Lab Results  Component Value Date   SARSCOV2NAA NEGATIVE 03/14/2021     Precautions: admitted as Covid Negative     PPE: Used by the provider:   N95   eye Goggles,  Gloves     Turkessa Ostrom 03/14/2021, 12:41 AM    [facility]     after 2 AM please page floor coverage PA If 7AM-7PM, please contact the day team taking  care of the patient using [email or web address]   Patient was evaluated in the context of the global COVID-19 pandemic, which necessitated consideration that the patient might be at risk for infection with the  SARS-CoV-2 virus that causes COVID-19. Institutional protocols and algorithms that pertain to the evaluation of patients at risk for COVID-19 are in a state of rapid change based on information released by regulatory bodies including the CDC and federal and state organizations. These policies and algorithms were followed during the patient's care.

## 2021-03-14 NOTE — Consult Note (Signed)
[facility] Tele[facility]urology Consult Services   Date of Service:   03/14/2021 20:59:34  Diagnosis:     .  H53.8 - Blurred Vision  Impression:     . 40 year old female presents with transient visual disturbance and headache. Suspect likely migrainous in nature due to the duration of symptoms closely followed by headache although she has never been formally diagnosed with migraine and has never had auras in the past. Therefore I would recommend headache cocktails in the ER, tox metabolic work-up, and admission for an MRI scan. Neuro follow-up.  Metrics: Last Known Well: 03/14/2021 18:30:00 [facility] Notification Time: 03/14/2021 20:59:34 Arrival Time: 03/14/2021 20:15:00 Stamp Time: 03/14/2021 20:59:34 Initial Response Time: 03/14/2021 21:01:13 Symptoms: Transient visual disturbance and headache. NIHSS Start Assessment Time: 03/14/2021 21:05:47 Patient is not a candidate for Thrombolytic. Thrombolytic Medical Decision: 03/14/2021 21:09:49 Patient was not deemed candidate for Thrombolytic because of following reasons: Resolved symptoms (no residual disabling symptoms). Other Diagnosis suspected.  CT head showed no acute hemorrhage or acute core infarct.  ED Physician notified of diagnostic impression and management plan on 03/14/2021 21:13:26  Advanced Imaging: Advanced Imaging Not Recommended because:  Clinical presentation is not suggestion of LVO or Low clinical suspicion of LVO based on presentation   Our recommendations are outlined below.  Recommendations:      .  Stroke/Telemetry Floor     .  Neuro Checks     .  Bedside Swallow Eval     .  DVT Prophylaxis     .  IV Fluids, Normal Saline     .  Head of Bed 30 Degrees     .  Euglycemia and Avoid Hyperthermia (PRN Acetaminophen)     .  Toxic/metabolic/infx workup per ED including UA, UDS     .  MRI brain without IV contrast     .  MRA head/neck     .  TSH, A1c, lipid profile     .   Transthoracic echocardiogram     .  Continuous telemetry     .  Physical, occupational, and speech therapies     .  q4h neuro checks/NIHSS     .  NPO until bedside swallow     .  Neurology follow-up  Routine Consultation with Inhouse Neurology for Follow up Care  Sign Out:     .  Discussed with Emergency Department Provider    ------------------------------------------------------------------------------  History of Present Illness: Patient is a 40 year old Female.  Patient was brought by private transportation with symptoms of Transient visual disturbance and headache.  40 year old female presents to the hospital with transient visual disturbance and headache. She reports that around 6:30 PM this evening she began having hemifield blurred vision on the left without clear vision loss. No scintillating scotomas. No distortions. Was present in both eyes on the left side. Lasted about 45 minutes and then resolved and was soon followed by a right frontal headache. She does have a history of headache although has never been diagnosed with migraine. She denies any other neurological symptoms. Her examination currently is nonfocal. She has no visual field cut currently.   Anticoagulant use:  No  Antiplatelet use: No  Allergies:  NKDA    Examination: BP(154/79), Pulse(94), Blood Glucose(/) 1A: Level of Consciousness - Alert; keenly responsive + 0 1B: Ask Month and Age - Both Questions Right + 0 1C: Blink Eyes & Squeeze Hands - Performs Both Tasks + 0 2: Test Horizontal Extraocular Movements - Normal + 0  3: Test Visual Fields - No Visual Loss + 0 4: Test Facial Palsy (Use Grimace if Obtunded) - Normal symmetry + 0 5A: Test Left Arm Motor Drift - No Drift for 10 Seconds + 0 5B: Test Right Arm Motor Drift - No Drift for 10 Seconds + 0 6A: Test Left Leg Motor Drift - No Drift for 5 Seconds + 0 6B: Test Right Leg Motor Drift - No Drift for 5 Seconds + 0 7: Test Limb Ataxia  (FNF/Heel-Shin) - No Ataxia + 0 8: Test Sensation - Normal; No sensory loss + 0 9: Test Language/Aphasia - Normal; No aphasia + 0 10: Test Dysarthria - Normal + 0 11: Test Extinction/Inattention - No abnormality + 0  NIHSS Score: 0   Pre-Morbid Modified Rankin Scale: 0 Points = No symptoms at all   Patient/Family was informed the Neurology Consult would occur via TeleHealth consult by way of interactive audio and video telecommunications and consented to receiving care in this manner.   Patient is being evaluated for possible acute neurologic impairment and high probability of imminent or life-threatening deterioration. I spent total of 35 minutes providing care to this patient, including time for face to face visit via telemedicine, review of medical records, imaging studies and discussion of findings with providers, the patient and/or family.   Dr Lacie Scotts   [facility] [phone]  Case [id number]

## 2021-03-14 NOTE — ED Notes (Signed)
Per Dr Effie Shy will provide update re: contacting [facility] for transfer. Elsie Ra RN advised. [employer]

## 2021-03-14 NOTE — H&P (Incomplete)
Jodi Wood WUJ:[id number] DOB: 18-Jul-1981 DOA: 03/14/2021     PCP: Patient, No Pcp Per (Inactive)   Outpatient Specialists: NONE    Patient arrived to ER on 03/14/21 at 2015 Referred by Attending Mancel Bale, MD   Patient coming from: home Lives alone,   *** With family    Chief Complaint:  Chief Complaint  Patient presents with  . Loss of Vision    HPI: Jodi Wood is a 40 y.o. female with medical history significant of ***     Presented with left-sided vision loss today while driving happened around 956 PM but returned back to normal.  This was associated with a headache.  Lasted for 45 minutes.  No history of prior migraines. No prior history of strokes   Infectious risk factors:  Reports*** fever, shortness of breath, dry cough, chest pain, Sore throat, URI symptoms, anosmia/change in taste, N/V/Diarrhea/abdominal pain,  Body aches, severe fatigue Reports known sick contacts, known COVID 19 exposure, inability to completely isolate     Has ***NOt been vaccinated against COVID **and boosted   Initial COVID TEST  NEGATIVE   Lab Results  Component Value Date   SARSCOV2NAA NEGATIVE 03/14/2021   SARSCOV2NAA Not Detected 11/29/2019     Regarding pertinent Chronic problems:  Chronic anemia - baseline hg Hemoglobin & Hematocrit  Recent Labs    03/14/21 2045  HGB 9.2*    While in ER: Initially code stroke was initiated CT head nonacute Was seen in consult by neurology tele specialist recommended headache cocktail and MRI neurology was to follow-up    ED Triage Vitals  Enc Vitals Group     BP 03/14/21 2019 (!) 141/81     Pulse Rate 03/14/21 2019 87     Resp 03/14/21 2019 16     Temp 03/14/21 2019 98.4 F (36.9 C)     Temp Source 03/14/21 2019 Oral     SpO2 03/14/21 2019 100 %     Weight 03/14/21 2019 175 lb (79.4 kg)     Height 03/14/21 2019 5' (1.524 m)     Head Circumference --      Peak Flow --      Pain Score 03/14/21 2031 8      Pain Loc --      Pain Edu? --      Excl. in GC? --   TMAX(24)@     _________________________________________ Significant initial  Findings: Abnormal Labs Reviewed  CBC - Abnormal; Notable for the following components:      Result Value   RBC 5.42 (*)    Hemoglobin 9.2 (*)    HCT 31.1 (*)    MCV 57.4 (*)    MCH 17.0 (*)    MCHC 29.6 (*)    RDW 21.1 (*)    Platelets 437 (*)    All other components within normal limits  COMPREHENSIVE METABOLIC PANEL - Abnormal; Notable for the following components:   Potassium 3.4 (*)    CO2 21 (*)    All other components within normal limits  URINALYSIS, ROUTINE W REFLEX MICROSCOPIC - Abnormal; Notable for the following components:   Color, Urine STRAW (*)    Hgb urine dipstick SMALL (*)    Bacteria, UA FEW (*)    All other components within normal limits   ____________________________________________ Ordered CT HEAD *** NON acute  CXR - ***NON acute  CTabd/pelvis - ***nonacute  CTA chest - ***nonacute, no PE, * no evidence of infiltrate  _________________________ Troponin ***ordered ECG: Ordered Personally reviewed by me showing: HR : *** Rhythm: *NSR, Sinus tachycardia * A.fib. W RVR, RBBB, LBBB, Paced Ischemic changes*nonspecific changes, no evidence of ischemic changes QTC* ____________________ This patient meets SIRS Criteria and may be septic. SIRS = Systemic Inflammatory Response Syndrome  Order a lactic acid level if needed AND/OR Initiate the sepsis protocol with the attached order set OR Click "Treating Associated Infection or Illness" if the patient is being treated for an infection that is a known cause of these abnormalities     The recent clinical data is shown below. Vitals:   03/14/21 2200 03/14/21 2230 03/14/21 2300 03/14/21 2330  BP: 111/73 113/73 121/82 112/71  Pulse: 71 71 71 78  Resp: 15 15 15 18   Temp:      TempSrc:      SpO2: 100% 100% 100% 100%  Weight:      Height:            WBC      Component Value Date/Time   WBC 6.7 03/14/2021 2045   LYMPHSABS 3.1 03/14/2021 2045   MONOABS 0.6 03/14/2021 2045   EOSABS 0.2 03/14/2021 2045   BASOSABS 0.0 03/14/2021 2045        Lactic Acid, Venous No results found for: LATICACIDVEN   Procalcitonin *** Ordered Lactic Acid, Venous No results found for: LATICACIDVEN   Procalcitonin *** Ordered   UA *** no evidence of UTI  ***Pending ***not ordered   Urine analysis:    Component Value Date/Time   COLORURINE STRAW (A) 03/14/2021 2144   APPEARANCEUR CLEAR 03/14/2021 2144   LABSPEC 1.005 03/14/2021 2144   PHURINE 6.0 03/14/2021 2144   GLUCOSEU NEGATIVE 03/14/2021 2144   HGBUR SMALL (A) 03/14/2021 2144   BILIRUBINUR NEGATIVE 03/14/2021 2144   KETONESUR NEGATIVE 03/14/2021 2144   PROTEINUR NEGATIVE 03/14/2021 2144   NITRITE NEGATIVE 03/14/2021 2144   LEUKOCYTESUR NEGATIVE 03/14/2021 2144    Results for orders placed or performed during the hospital encounter of 03/14/21  Resp Panel by RT-PCR (Flu A&B, Covid) Nasopharyngeal Swab     Status: None   Collection Time: 03/14/21  8:45 PM   Specimen: Nasopharyngeal Swab; Nasopharyngeal(NP) swabs in vial transport medium  Result Value Ref Range Status   SARS Coronavirus 2 by RT PCR NEGATIVE NEGATIVE Final    Comment: (NOTE) SARS-CoV-2 target nucleic acids are NOT DETECTED.  The SARS-CoV-2 RNA is generally detectable in upper respiratory specimens during the acute phase of infection. The lowest concentration of SARS-CoV-2 viral copies this assay can detect is 138 copies/mL. A negative result does not preclude SARS-Cov-2 infection and should not be used as the sole basis for treatment or other patient management decisions. A negative result may occur with  improper specimen collection/handling, submission of specimen other than nasopharyngeal swab, presence of viral mutation(s) within the areas targeted by this assay, and inadequate number of viral copies(<138 copies/mL). A  negative result must be combined with clinical observations, patient history, and epidemiological information. The expected result is Negative.  Fact Sheet for Patients:  03/16/21  Fact Sheet for Healthcare Providers:  BloggerCourse.com  This test is no t yet approved or cleared by the [place] FDA and  has been authorized for detection and/or diagnosis of SARS-CoV-2 by FDA under an Emergency Use Authorization (EUA). This EUA will remain  in effect (meaning this test can be used) for the duration of the COVID-19 declaration under Section 564(b)(1) of the Act, 21 U.S.C.section 360bbb-3(b)(1), unless the authorization  is terminated  or revoked sooner.       Influenza A by PCR NEGATIVE NEGATIVE Final   Influenza B by PCR NEGATIVE NEGATIVE Final    Comment: (NOTE) The Xpert Xpress SARS-CoV-2/FLU/RSV plus assay is intended as an aid in the diagnosis of influenza from Nasopharyngeal swab specimens and should not be used as a sole basis for treatment. Nasal washings and aspirates are unacceptable for Xpert Xpress SARS-CoV-2/FLU/RSV testing.  Fact Sheet for Patients: BloggerCourse.com  Fact Sheet for Healthcare Providers: [employer]ious[employer]ker.it  This test is not yet approved or cleared by the [place] FDA and has been authorized for detection and/or diagnosis of SARS-CoV-2 by FDA under an Emergency Use Authorization (EUA). This EUA will remain in effect (meaning this test can be used) for the duration of the COVID-19 declaration under Section 564(b)(1) of the Act, 21 U.S.C. section 360bbb-3(b)(1), unless the authorization is terminated or revoked.  Performed at [facility], [address] [place], [place] [address]      _______________________________________________________ ER Provider Called:     DrMarland Kitchen  They Recommend admit to medicine  *** Will see in AM  ***SEEN in ER _______________________________________________ Hospitalist was called for admission for ***  The following Work up has been ordered so far:  Orders Placed This Encounter  Procedures  . Critical Care  . Resp Panel by RT-PCR (Flu A&B, Covid) Nasopharyngeal Swab  . CT HEAD CODE STROKE WO CONTRAST  . Ethanol  . Protime-INR  . APTT  . CBC  . Differential  . Comprehensive metabolic panel  . Urine rapid drug screen (hosp performed)  . Urinalysis, Routine w reflex microscopic  . hCG, quantitative, pregnancy  . Diet NPO time specified  . Vital signs  . Cardiac Monitoring  . Modified Stroke Scale (mNIHSS) Document mNIHSS assessment every 2 hours for a total of 12 hours  . Stroke swallow screen  . Initiate Carrier Fluid Protocol  . If O2 sat If O2 Sat < 94%, administer O2 at 2 liters/minute via nasal cannula.  [place] to Activate Code Stroke Call [facility] [phone]  . Consult to hospitalist  . Pulse oximetry, continuous  . I-stat chem 8, ED  . I-Stat beta hCG blood, ED  . ED EKG  . EKG 12-Lead  . Saline lock IV      Following Medications were ordered in ER: Medications  ketorolac (TORADOL) 30 MG/ML injection 15 mg (15 mg Intravenous Given 03/14/21 2135)        Consult Orders  (From admission, onward)         Start     Ordered   03/14/21 2341  Consult to hospitalist  Once       Provider:  (Not yet assigned)  Question Answer Comment  Place call to: [facility]   Reason for Consult Admit      03/14/21 2340            OTHER Significant initial  Findings:  labs showing:    Recent Labs  Lab 03/14/21 2045  NA 138  K 3.4*  CO2 21*  GLUCOSE 95  BUN 8  CREATININE 0.79  CALCIUM 9.4    Cr  * stable,  Up from baseline see below Lab Results  Component Value Date   CREATININE 0.79 03/14/2021    Recent Labs  Lab 03/14/21 2045  AST 19  ALT 16  ALKPHOS 59  BILITOT 0.3  PROT 7.8  ALBUMIN 4.5   Lab Results   Component Value  Date   CALCIUM 9.4 03/14/2021          Plt: Lab Results  Component Value Date   PLT 437 (H) 03/14/2021       COVID-19 Labs  No results for input(s): DDIMER, FERRITIN, LDH, CRP in the last 72 hours.  Lab Results  Component Value Date   SARSCOV2NAA NEGATIVE 03/14/2021   SARSCOV2NAA Not Detected 11/29/2019     Arterial ***Venous  Blood Gas result:  pH *** pCO2 ***; pO2 ***;     %O2 Sat ***.  ABG No results found for: PHART, PCO2ART, PO2ART, HCO3, TCO2, ACIDBASEDEF, O2SAT       Recent Labs  Lab 03/14/21 2045  WBC 6.7  NEUTROABS 2.7  HGB 9.2*  HCT 31.1*  MCV 57.4*  PLT 437*    HG/HCT * stable,  Down *Up from baseline see below    Component Value Date/Time   HGB 9.2 (L) 03/14/2021 2045   HCT 31.1 (L) 03/14/2021 2045   MCV 57.4 (L) 03/14/2021 2045      No results for input(s): LIPASE, AMYLASE in the last 168 hours. No results for input(s): AMMONIA in the last 168 hours.   Cardiac Panel (last 3 results) No results for input(s): CKTOTAL, CKMB, TROPONINI, RELINDX in the last 72 hours.   BNP (last 3 results) No results for input(s): BNP in the last 8760 hours.    DM  labs:  HbA1C: No results for input(s): HGBA1C in the last 8760 hours.     CBG (last 3)  No results for input(s): GLUCAP in the last 72 hours.        Cultures: No results found for: SDES, SPECREQUEST, CULT, REPTSTATUS   Radiological Exams on Admission: CT HEAD CODE STROKE WO CONTRAST  Result Date: 03/14/2021 CLINICAL DATA:  Code stroke. Initial evaluation for acute left-sided visual loss. EXAM: CT HEAD WITHOUT CONTRAST TECHNIQUE: Contiguous axial images were obtained from the base of the skull through the vertex without intravenous contrast. COMPARISON:  None. FINDINGS: Brain: Cerebral volume within normal limits for patient age. No evidence for acute intracranial hemorrhage. No findings to suggest acute large vessel territory infarct. No mass lesion, midline shift, or  mass effect. Ventricles are normal in size without evidence for hydrocephalus. No extra-axial fluid collection identified. 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 Vascular: No hyperdense vessel identified. Skull: Scalp soft tissues demonstrate no acute abnormality. Calvarium intact. Sinuses/Orbits: Globes and orbital soft tissues within normal limits. Visualized paranasal sinuses are clear. No mastoid effusion. ASPECTS [facility] Stroke Program Early CT Score) - Ganglionic level infarction (caudate, lentiform nuclei, internal capsule, insula, M1-M3 cortex): 7 - Supraganglionic infarction (M4-M6 cortex): 3 Total score (0-10 with 10 being normal): 10 IMPRESSION: 1. No acute intracranial abnormality. 2. ASPECTS is 10. 3. Chiari 1 malformation. Critical Value/emergent results were called by telephone at the time of interpretation on 03/14/2021 at 9:11 pm to provider [facility] , who verbally acknowledged these results. Electronically Signed   By: Rise Mu M.D.   On: 03/14/2021 21:11   _______________________________________________________________________________________________________ Latest  Blood pressure 112/71, pulse 78, temperature 97.9 F (36.6 C), temperature source Oral, resp. rate 18, height 5' (1.524 m), weight 79.4 kg, SpO2 100 %.   Review of Systems:    Pertinent positives include: ***  Constitutional:  No weight loss, night sweats, Fevers, chills, fatigue, weight loss  HEENT:  No headaches, Difficulty swallowing,Tooth/dental problems,Sore throat,  No sneezing, itching, ear ache, nasal congestion, post nasal  drip,  Cardio-vascular:  No chest pain, Orthopnea, PND, anasarca, dizziness, palpitations.no Bilateral lower extremity swelling  GI:  No heartburn, indigestion, abdominal pain, nausea, vomiting, diarrhea, change in bowel habits, loss of appetite, melena, blood in stool, hematemesis Resp:  no shortness of breath at  rest. No dyspnea on exertion, No excess mucus, no productive cough, No non-productive cough, No coughing up of blood.No change in color of mucus.No wheezing. Skin:  no rash or lesions. No jaundice GU:  no dysuria, change in color of urine, no urgency or frequency. No straining to urinate.  No flank pain.  Musculoskeletal:  No joint pain or no joint swelling. No decreased range of motion. No back pain.  Psych:  No change in mood or affect. No depression or anxiety. No memory loss.  Neuro: no localizing neurological complaints, no tingling, no weakness, no double vision, no gait abnormality, no slurred speech, no confusion  All systems reviewed and apart from HOPI all are negative _______________________________________________________________________________________________ Past Medical History:  History reviewed. No pertinent past medical history.    Past Surgical History:  Procedure Laterality Date  . CESAREAN SECTION      Social History:  Ambulatory *** independently cane, walker  wheelchair bound, bed bound     reports that she has never smoked. She has never used smokeless tobacco. She reports previous alcohol use. She reports that she does not use drugs.     Family History: *** Family History  Adopted: Yes   ______________________________________________________________________________________________ Allergies: No Known Allergies   Prior to Admission medications   Medication Sig Start Date End Date Taking? Authorizing Provider  ibuprofen (ADVIL) 200 MG tablet Take 400 mg by mouth every 6 (six) hours as needed for mild pain.   Yes [provider]  Multiple Vitamin (MULTIVITAMIN WITH MINERALS) TABS tablet Take 1 tablet by mouth daily.   Yes [provider]  naproxen (NAPROSYN) 500 MG tablet Take 1 tablet (500 mg total) by mouth 2 (two) times daily. Patient not taking: No sig reported 02/21/20   Hall-Potvin, [place], PA-C  pantoprazole (PROTONIX) 20  MG tablet Take 1 tablet (20 mg total) by mouth daily. Patient not taking: Reported on 02/21/2020 11/29/19 02/21/20  Hall-Potvin, [place], PA-C    ___________________________________________________________________________________________________ Physical Exam: Vitals with BMI 03/14/2021 03/14/2021 03/14/2021  Height - - -  Weight - - -  BMI - - -  Systolic 112 121 [id number]  Diastolic 71 82 73  Pulse 78 71 71     1. General:  in No ***Acute distress***increased work of breathing ***complaining of severe pain****agitated * Chronically ill *well *cachectic *toxic acutely ill -appearing 2. Psychological: Alert and *** Oriented 3. Head/ENT:   Moist *** Dry Mucous Membranes                          Head Non traumatic, neck supple                          Normal *** Poor Dentition 4. SKIN: normal *** decreased Skin turgor,  Skin clean Dry and intact no rash 5. Heart: Regular rate and rhythm no*** Murmur, no Rub or gallop 6. Lungs: ***Clear to auscultation bilaterally, no wheezes or crackles   7. Abdomen: Soft, ***non-tender, Non distended *** obese ***bowel sounds present 8. Lower extremities: no clubbing, cyanosis, no ***edema 9. Neurologically Grossly intact, moving all 4 extremities equally *** strength 5 out of 5 in all 4 extremities  cranial nerves II through XII intact 10. MSK: Normal range of motion    Chart has been reviewed  ______________________________________________________________________________________________  Assessment/Plan  ***  Admitted for ***  Present on Admission: **None**   Other plan as per orders.  DVT prophylaxis:  SCD *** Lovenox       Code Status:    Code Status: Not on file FULL CODE *** DNR/DNI ***comfort care as per patient ***family  I had personally discussed CODE STATUS with patient and family* I had spent *min discussing goals of care and CODE STATUS    Family Communication:   Family not at  Bedside  plan of care was discussed on the phone  with *** Son, Daughter, Wife, Husband, Sister, Brother , father, mother  Disposition Plan:   *** likely will need placement for rehabilitation                          Back to current facility when stable                            To home once workup is complete and patient is stable  ***Following barriers for discharge:                            Electrolytes corrected                               Anemia corrected                             Pain controlled with PO medications                               Afebrile, white count improving able to transition to PO antibiotics                             Will need to be able to tolerate PO                            Will likely need home health, home O2, set up                           Will need consultants to evaluate patient prior to discharge  ****EXPECT DC tomorrow                    ***Would benefit from PT/OT eval prior to DC  Ordered                   Swallow eval - SLP ordered                   Diabetes care coordinator                   Transition of care consulted                   Nutrition    consulted                  Wound care  consulted  Palliative care    consulted                   Behavioral health  consulted                    Consults called: ***    Admission status:  ED Disposition    None       Obs***  ***  inpatient     I Expect 2 midnight stay secondary to severity of patient's current illness need for inpatient interventions justified by the following: ***hemodynamic instability despite optimal treatment (tachycardia *hypotension * tachypnea *hypoxia, hypercapnia) * Severe lab/radiological/exam abnormalities including:     and extensive comorbidities including: *substance abuse  *Chronic pain *DM2  * CHF * CAD  * COPD/asthma *Morbid Obesity * CKD *dementia *liver disease *history of stroke with residual deficits *  malignancy, * sickle cell disease  . History of  amputation . Chronic anticoagulation  That are currently affecting medical management.   I expect  patient to be hospitalized for 2 midnights requiring inpatient medical care.  Patient is at high risk for adverse outcome (such as loss of life or disability) if not treated.  Indication for inpatient stay as follows:  Severe change from baseline regarding mental status Hemodynamic instability despite maximal medical therapy,  ongoing suicidal ideations,  severe pain requiring acute inpatient management,  inability to maintain oral hydration   persistent chest pain despite medical management Need for operative/procedural  intervention New or worsening hypoxia  Need for IV antibiotics, IV fluids, IV rate controling medications, IV antihypertensives, IV pain medications, IV anticoagulation    Level of care   *** tele  For 12H 24H     medical floor       SDU tele indefinitely please discontinue once patient no longer qualifies COVID-19 Labs    Lab Results  Component Value Date   SARSCOV2NAA NEGATIVE 03/14/2021     Precautions: admitted as *** Covid Negative  ***asymptomatic screening protocol****PUI *** covid positive No active isolations ***If Covid PCR is negative  - please DC precautions - would need additional investigation given very high risk for false native test result   PPE: Used by the provider:   N95***P100  eye Goggles,  Gloves ***gown     Critical***  Patient is critically ill due to  hemodynamic instability * respiratory failure *severe sepsis* ongoing chest pain*  They are at high risk for life/limb threatening clinical deterioration requiring frequent reassessment and modifications of care.  Services provided include examination of the patient, review of relevant ancillary tests, prescription of lifesaving therapies, review of medications and prophylactic therapy.  Total critical care time excluding separately billable procedures: 60*   Minutes.    Keimya Briddell 03/14/2021, 11:51 PM ***  [facility]     after 2 AM please page floor coverage PA If 7AM-7PM, please contact the day team taking care of the patient using [email or web address]   Patient was evaluated in the context of the global COVID-19 pandemic, which necessitated consideration that the patient might be at risk for infection with the SARS-CoV-2 virus that causes COVID-19. Institutional protocols and algorithms that pertain to the evaluation of patients at risk for COVID-19 are in a state of rapid change based on information released by regulatory bodies including the CDC and federal and state organizations. These policies and algorithms were followed during the patient's care.

## 2021-03-14 NOTE — ED Notes (Signed)
IV placed and Blood work obtained and sent to lab.

## 2021-03-15 ENCOUNTER — Observation Stay (HOSPITAL_COMMUNITY): Payer: BLUE CROSS/BLUE SHIELD

## 2021-03-15 ENCOUNTER — Observation Stay (HOSPITAL_BASED_OUTPATIENT_CLINIC_OR_DEPARTMENT_OTHER): Payer: BLUE CROSS/BLUE SHIELD

## 2021-03-15 DIAGNOSIS — D500 Iron deficiency anemia secondary to blood loss (chronic): Secondary | ICD-10-CM

## 2021-03-15 DIAGNOSIS — H547 Unspecified visual loss: Secondary | ICD-10-CM | POA: Diagnosis not present

## 2021-03-15 DIAGNOSIS — D649 Anemia, unspecified: Secondary | ICD-10-CM | POA: Diagnosis present

## 2021-03-15 DIAGNOSIS — H5347 Heteronymous bilateral field defects: Secondary | ICD-10-CM | POA: Diagnosis present

## 2021-03-15 DIAGNOSIS — G459 Transient cerebral ischemic attack, unspecified: Secondary | ICD-10-CM

## 2021-03-15 DIAGNOSIS — Q282 Arteriovenous malformation of cerebral vessels: Secondary | ICD-10-CM | POA: Diagnosis not present

## 2021-03-15 LAB — LIPID PANEL
Cholesterol: 142 mg/dL (ref 0–200)
HDL: 51 mg/dL (ref >40)
LDL Cholesterol: 83 mg/dL (ref 0–99)
Total CHOL/HDL Ratio: 2.8 RATIO
Triglycerides: 40 mg/dL (ref <150)
VLDL: 8 mg/dL (ref 0–40)

## 2021-03-15 LAB — RETICULOCYTES
Immature Retic Fract: 13.8 % (ref 2.3–15.9)
RBC.: 5.14 MIL/uL — ABNORMAL HIGH (ref 3.87–5.11)
Retic Count, Absolute: 35 10*3/uL (ref 19.0–186.0)
Retic Ct Pct: 0.7 % (ref 0.4–3.1)

## 2021-03-15 LAB — FOLATE: Folate: 13.9 ng/mL (ref >5.9)

## 2021-03-15 LAB — HEMOGLOBIN A1C
Hgb A1c MFr Bld: 5.6 % (ref 4.8–5.6)
Mean Plasma Glucose: 114.02 mg/dL

## 2021-03-15 LAB — VITAMIN B12: Vitamin B-12: 277 pg/mL (ref 180–914)

## 2021-03-15 LAB — ECHOCARDIOGRAM COMPLETE
Area-P 1/2: 3.03 cm2
Height: 60 in
S' Lateral: 2.5 cm
Weight: 2800 oz

## 2021-03-15 LAB — IRON AND TIBC
Iron: 18 ug/dL — ABNORMAL LOW (ref 28–170)
Saturation Ratios: 4 % — ABNORMAL LOW (ref 10.4–31.8)
TIBC: 426 ug/dL (ref 250–450)
UIBC: 408 ug/dL

## 2021-03-15 LAB — HIV ANTIBODY (ROUTINE TESTING W REFLEX): HIV Screen 4th Generation wRfx: NONREACTIVE

## 2021-03-15 LAB — FERRITIN: Ferritin: 5 ng/mL — ABNORMAL LOW (ref 11–307)

## 2021-03-15 LAB — CK: Total CK: 76 U/L (ref 38–234)

## 2021-03-15 MED ORDER — CYANOCOBALAMIN 1000 MCG PO TABS: 1000.0000 ug | ORAL_TABLET | Freq: Every day | ORAL | 0 refills | Status: AC

## 2021-03-15 MED ORDER — SODIUM CHLORIDE 0.9 % IV SOLN: INTRAVENOUS | Status: DC

## 2021-03-15 MED ORDER — ACETAMINOPHEN 325 MG PO TABS
650.0000 mg | ORAL_TABLET | ORAL | Status: DC | PRN
  Administered 2021-03-15: 650 mg via ORAL
  Filled 2021-03-15: qty 2

## 2021-03-15 MED ORDER — FERROUS SULFATE 325 (65 FE) MG PO TBEC: 325.0000 mg | DELAYED_RELEASE_TABLET | Freq: Every day | ORAL | 0 refills | Status: AC

## 2021-03-15 MED ORDER — ACETAMINOPHEN 650 MG RE SUPP: 650.0000 mg | RECTAL | Status: DC | PRN

## 2021-03-15 MED ORDER — CYANOCOBALAMIN 1000 MCG/ML IJ SOLN
1000.0000 ug | Freq: Once | INTRAMUSCULAR | Status: AC
  Administered 2021-03-15: 1000 ug via INTRAMUSCULAR
  Filled 2021-03-15: qty 1

## 2021-03-15 MED ORDER — STROKE: EARLY STAGES OF RECOVERY BOOK
Freq: Once | Status: DC
  Filled 2021-03-15: qty 1

## 2021-03-15 MED ORDER — SODIUM CHLORIDE 0.9 % IV SOLN
510.0000 mg | Freq: Once | INTRAVENOUS | Status: AC
  Administered 2021-03-15: 510 mg via INTRAVENOUS
  Filled 2021-03-15: qty 510

## 2021-03-15 MED ORDER — ACETAMINOPHEN 160 MG/5ML PO SOLN: 650.0000 mg | ORAL | Status: DC | PRN

## 2021-03-15 MED ORDER — VITAMIN B-12 1000 MCG PO TABS: 1000.0000 ug | ORAL_TABLET | Freq: Every day | ORAL | Status: DC

## 2021-03-15 MED ORDER — IOHEXOL 350 MG/ML SOLN
75.0000 mL | Freq: Once | INTRAVENOUS | Status: AC | PRN
  Administered 2021-03-15: 75 mL via INTRAVENOUS

## 2021-03-15 MED ORDER — IBUPROFEN 200 MG PO TABS: 400.0000 mg | ORAL_TABLET | Freq: Three times a day (TID) | ORAL | Status: DC | PRN

## 2021-03-15 NOTE — Progress Notes (Signed)
Spoke with Lamar Laundry RN about imaging of MRA.

## 2021-03-15 NOTE — Progress Notes (Signed)
  Echocardiogram 2D Echocardiogram has been performed.  Jodi Wood 03/15/2021, 3:42 PM

## 2021-03-15 NOTE — Progress Notes (Signed)
Unable to run MRA due to iron supplementation given prior to MRI. Images attempted too much artifact for MRA to be read out. Spoke with Dr Genia Hotter radiologist. MRI without completed.

## 2021-03-15 NOTE — Progress Notes (Signed)
OT Cancellation Note  Patient Details Name: Jodi Wood MRN: [id number] DOB: 23-Oct-1981   Cancelled Treatment:    Reason Eval/Treat Not Completed: OT screened, no needs identified, will sign off. Patient reports all visual deficits have resolved. Reports no physical or sensation impairments. Reports ability to ambulate to bathroom and perform all tasks as usual. OT evaluation not indicated at this time.  Jodi Wood L Jerre Vandrunen 03/15/2021, 4:10 PM

## 2021-03-15 NOTE — Progress Notes (Signed)
Carotid duplex has been completed.   Preliminary results in CV Proc.   Blanch Media 03/15/2021 8:36 AM

## 2021-03-15 NOTE — Discharge Summary (Signed)
Discharge Summary  Jodi Wood IZT:[id number] DOB: [birth year]  PCP: Patient, No Pcp Per (Inactive)  Admit date: 03/14/2021 Discharge date: 03/15/2021  Time spent: 35 mins  Recommendations for Outpatient Follow-up:  1. Patient to establish care with PCP as she just recently moved to [facility] 2. Follow-up with neurology, neuro interventional   Discharge Diagnoses:  Active Hospital Problems   Diagnosis Date Noted  . Loss of vision 03/15/2021  . Anemia 03/15/2021  . Hemianopsia 03/15/2021    Resolved Hospital Problems  No resolved problems to display.    Discharge Condition: Stable  Diet recommendation: Heart healthy  Vitals:   03/15/21 1643 03/15/21 1930  BP: 130/80 134/82  Pulse: 90 76  Resp: 16 18  Temp:    SpO2: 100% 100%    History of present illness:  40 year-old female presents with transient loss of left-sided vision loss while driving on 3/82/5053.  Patient initially reported some blurry vision, which progressed to left-sided vision loss that lasted for about 30 minutes and fully recovered vision by the time she came to the ER.  Patient also reported unilateral headache, which she reports intermittently happens, feels like pain is at the back of her eyes.  Patient usually has frequent bad headaches but has never been diagnosed with migraines.  Patient denies any nausea/vomiting, denies any loss of sensation, extremity weakness or facial droop.  No prior history of strokes, or this event ever happening.  Patient was adopted and does not know her family history.  Patient denies any other medical issues except for heavy menstrual periods with associated anemia.  Unable to tolerate oral iron supplements.  In the ED, patient remained completely asymptomatic, vital signs stable, labs fairly stable except for hemoglobin of 9.2, iron 18, sats 4, ferritin 5, vitamin B12 277.  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  Neuro interventional consultation is recommended and catheter-based angiography should be considered for further evaluation.  No intracranial large vessel occlusion or proximal high-grade arterial stenosis.  Discussed with neurologist Dr. Selina Cooley, who will see patient this evening and possibly recommend outpatient neuro interventional consultation as patient is very eager to be discharged.  Patient refused to be admitted upstairs.     Today, patient denies any further left-sided vision loss, denies any significant headaches, nausea/vomiting, chest pain, shortness of breath, abdominal pain, fever/chills.  Patient very upset about being in the hospital this long and imaging not done quickly.  Patient expressed extreme frustration about her hospital experience and wants to be discharged today.  I discussed extensively with patient for the need to see at least a neurologist before she can be discharged due to the results of her MRI/CTA.  Patient agreed to see a neurologist, but would like to go home afterwards.  Discussed with Dr. Darlyn Read from neurology, who graciously has agreed to see patient this evening in the ER and would arrange follow-up with a neurointervention as an outpatient.    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  No intracranial large vessel occlusion or proximal high-grade arterial stenosis Echo with normal EF Carotid Doppler with no abnormality Neurointerventional consultation is recommended and catheter-based angiography  should be considered for further evaluation Discussed with neurologist Dr. Selina Cooley, will see patient in consultation and plan for outpatient neuro interventional consultation as patient is very eager to be discharged Advised close follow-up with neurology and neuro interventional  Hypokalemia Replaced as needed  Iron deficiency anemia Vitamin B12 deficiency Patient has a history of menorrhagia Anemia panel showed iron 18, sats 4, ferritin 5, vitamin B12-->277, folate 13.9 Has had follow-up with OB/GYN/PCP 1 dose of Feraheme given on 03/15/2021 Continue oral iron and Vit B12 supplementation  Obesity Lifestyle modification advised       Estimated body mass index is 34.18 kg/m as calculated from the following:   Height as of this encounter: 5' (1.524 m).   Weight as of this encounter: 79.4 kg.    Procedures:  None  Consultations: Neurology   Discharge Exam: BP 134/82   Pulse 76   Temp 97.9 F (36.6 C) (Oral)   Resp 18   Ht 5' (1.524 m)   Wt 79.4 kg   LMP  (LMP Unknown)   SpO2 100%   BMI 34.18 kg/m   General: NAD Cardiovascular: S1, S2 present Respiratory: CTA B Neurology: Strength equal in all extremities, vision intact, sensation intact, no obvious focal neurologic deficits noted     Discharge Instructions You were cared for by a hospitalist during your hospital stay. If you have any questions about your discharge medications or the care you received while you were in the hospital after you are discharged, you can call the unit and asked to speak with the hospitalist on call if the hospitalist that took care of you is not available. Once you are discharged, your primary care physician will handle any further medical issues. Please note that NO REFILLS for any discharge medications will be authorized once you are discharged, as it is imperative that you return to your primary care physician (or establish a relationship with a primary care physician if you do not  have one) for your aftercare needs so that they can reassess your need for medications and monitor your lab values.   Allergies as of 03/15/2021   No Known Allergies     Medication List    STOP taking these medications   naproxen 500 MG tablet Commonly known as: NAPROSYN     TAKE these medications   cyanocobalamin 1000 MCG tablet Take 1 tablet (1,000 mcg total) by mouth daily. Start taking on: March 16, 2021   ferrous sulfate 325 (65 FE) MG EC tablet Take 1 tablet (325 mg total) by mouth daily with breakfast.   ibuprofen 200 MG tablet Commonly known as: ADVIL Take 2 tablets (400 mg total) by mouth every 8 (eight) hours as needed for mild pain. What changed: when to take this   multivitamin with minerals Tabs tablet Take 1 tablet by mouth daily.      No Known Allergies  Follow-up Information    Gilmer Mor, DO Follow up.   Specialties: Interventional Radiology, Radiology Why: They will call you to arrange for outpatient imaging Contact information: [address] [place] [address] [phone]                The results of significant diagnostics from this hospitalization (including imaging, microbiology, ancillary and laboratory) are listed below for reference.    Significant Diagnostic Studies:  CT ANGIO HEAD W OR WO CONTRAST  Result Date: 03/15/2021 CLINICAL DATA:  Transient ischemic attack (TIA). Additional history provided: Transient left-sided vision loss small driving. EXAM: CT ANGIOGRAPHY HEAD AND NECK TECHNIQUE: Multidetector CT imaging of the head and neck was performed using the standard protocol during bolus administration of intravenous contrast. Multiplanar CT image reconstructions and MIPs were obtained to evaluate the vascular anatomy. Carotid stenosis measurements (when applicable) are obtained utilizing NASCET criteria, using the distal internal carotid diameter as the denominator. CONTRAST:  75mL OMNIPAQUE IOHEXOL 350 MG/ML SOLN  COMPARISON:  Brain MRI performed earlier today 03/15/2021. Noncontrast head CT performed earlier today 03/14/2021. FINDINGS: CTA NECK FINDINGS Aortic arch: Common origin of the innominate and left common carotid arteries. The visualized aortic arch is normal in caliber. No hemodynamically significant innominate or proximal subclavian artery stenosis. Right carotid system: CCA and ICA patent within the neck without stenosis. No significant atherosclerotic disease. Left carotid system: CCA and ICA patent within the neck without stenosis. No significant atherosclerotic disease. Vertebral arteries: Vertebral arteries patent within the neck without stenosis. Left vertebral artery dominant. Skeleton: No acute bony abnormality or aggressive osseous lesion. Other neck: No cervical lymphadenopathy. Multiple thyroid nodules, the largest within the right lobe measuring 1.5 cm. Upper chest: No consolidation within the imaged lung apices. Review of the MIP images confirms the above findings CTA HEAD FINDINGS Anterior circulation: The intracranial internal carotid arteries are patent. The M1 middle cerebral arteries are patent. No M2 proximal branch occlusion or high-grade proximal stenosis is identified. The anterior cerebral arteries are patent. No intracranial aneurysm is identified. Posterior circulation: The intracranial vertebral arteries are patent. The basilar artery is patent. The posterior cerebral arteries are patent. Posterior communicating arteries are hypoplastic or absent bilaterally. There is a nidus of abnormal arterial enhancing vessels within the right occipital lobe compatible with an arteriovenous malformation (AVM) (for instance as seen on series 7, image 239) (series 10, image 49). Given its location, this AVM is likely at least partially fed by the right posterior cerebral artery. Venous sinuses: Within the limitations of contrast timing, no convincing thrombus. Anatomic variants: As described. Review of  the MIP images confirms the above findings IMPRESSION: CTA neck: 1. The common carotid, internal carotid and vertebral arteries are patent within the neck without stenosis or significant atherosclerotic disease. 2. Multiple thyroid nodules, the largest measuring 1.5 cm within the right lobe. Nonemergent thyroid ultrasound recommended for further evaluation. CTA head: 1. Nidus of abnormal arterially enhancing vessels within the right occipital lobe compatible with arteriovenous malformation (AVM). Given its location, this AVM is likely at least partially fed by the right posterior cerebral artery. Neurointerventional consultation is recommended, and catheter-based angiography should be considered for further evaluation. 2. No intracranial large vessel occlusion or proximal high-grade arterial stenosis. Electronically Signed   By: Jackey Loge DO   On: 03/15/2021 17:21   CT ANGIO NECK W OR WO CONTRAST  Result Date: 03/15/2021 CLINICAL DATA:  Transient ischemic attack (TIA). Additional history provided: Transient left-sided vision loss small driving. EXAM: CT ANGIOGRAPHY HEAD AND NECK TECHNIQUE: Multidetector CT imaging of the head and neck was performed using the standard protocol during bolus administration of intravenous contrast. Multiplanar CT image reconstructions and MIPs were obtained to evaluate the vascular anatomy. Carotid stenosis measurements (when applicable) are obtained utilizing NASCET criteria, using the distal internal carotid diameter as the denominator. CONTRAST:  75mL OMNIPAQUE IOHEXOL 350 MG/ML SOLN COMPARISON:  Brain MRI performed earlier today 03/15/2021. Noncontrast head CT  performed earlier today 03/14/2021. FINDINGS: CTA NECK FINDINGS Aortic arch: Common origin of the innominate and left common carotid arteries. The visualized aortic arch is normal in caliber. No hemodynamically significant innominate or proximal subclavian artery stenosis. Right carotid system: CCA and ICA patent within  the neck without stenosis. No significant atherosclerotic disease. Left carotid system: CCA and ICA patent within the neck without stenosis. No significant atherosclerotic disease. Vertebral arteries: Vertebral arteries patent within the neck without stenosis. Left vertebral artery dominant. Skeleton: No acute bony abnormality or aggressive osseous lesion. Other neck: No cervical lymphadenopathy. Multiple thyroid nodules, the largest within the right lobe measuring 1.5 cm. Upper chest: No consolidation within the imaged lung apices. Review of the MIP images confirms the above findings CTA HEAD FINDINGS Anterior circulation: The intracranial internal carotid arteries are patent. The M1 middle cerebral arteries are patent. No M2 proximal branch occlusion or high-grade proximal stenosis is identified. The anterior cerebral arteries are patent. No intracranial aneurysm is identified. Posterior circulation: The intracranial vertebral arteries are patent. The basilar artery is patent. The posterior cerebral arteries are patent. Posterior communicating arteries are hypoplastic or absent bilaterally. There is a nidus of abnormal arterial enhancing vessels within the right occipital lobe compatible with an arteriovenous malformation (AVM) (for instance as seen on series 7, image 239) (series 10, image 49). Given its location, this AVM is likely at least partially fed by the right posterior cerebral artery. Venous sinuses: Within the limitations of contrast timing, no convincing thrombus. Anatomic variants: As described. Review of the MIP images confirms the above findings IMPRESSION: CTA neck: 1. The common carotid, internal carotid and vertebral arteries are patent within the neck without stenosis or significant atherosclerotic disease. 2. Multiple thyroid nodules, the largest measuring 1.5 cm within the right lobe. Nonemergent thyroid ultrasound recommended for further evaluation. CTA head: 1. Nidus of abnormal arterially  enhancing vessels within the right occipital lobe compatible with arteriovenous malformation (AVM). Given its location, this AVM is likely at least partially fed by the right posterior cerebral artery. Neurointerventional consultation is recommended, and catheter-based angiography should be considered for further evaluation. 2. No intracranial large vessel occlusion or proximal high-grade arterial stenosis. Electronically Signed   By: Jackey Loge DO   On: 03/15/2021 17:21   MR BRAIN WO CONTRAST  Addendum Date: 03/15/2021   ADDENDUM REPORT: 03/15/2021 15:05 ADDENDUM: Limitations, findings and recommendations called by telephone at the time of interpretation on 03/15/2021 at 3:04 pm to provider Dr Cristal Generous, who verbally acknowledged these results. Electronically Signed   By: Jackey Loge DO   On: 03/15/2021 15:05   Result Date: 03/15/2021 CLINICAL DATA:  Neuro deficit, acute, stroke suspected. Additional history provided: Episode of left sided vision loss last night while driving, vision now back to normal, left-sided weakness. EXAM: MRI HEAD WITHOUT CONTRAST TECHNIQUE: Multiplanar, multiecho pulse sequences of the brain and surrounding structures were obtained without intravenous contrast. COMPARISON:  Noncontrast head CT 03/14/2021. FINDINGS: Due to oral iron supplementation prior to imaging, significant artifact affects multiple sequences. The diffusion-weighted and SWI sequences are most notably affected. Brain: Cerebral volume is normal. There is an apparent nidus of abnormal vessels within the right occipital lobe with subtle surrounding gliosis (for instance as seen on series 9, image 25). Chiari I malformation. The cerebellar tonsils extend 15 mm below the foramen magnum. Associated crowding at the level of the foramen magnum. Partially empty sella turcica. There is no evidence of acute infarct. However, artifact limits the reliability of the  diffusion-weighted sequences, and an acute infarct  cannot be excluded on the basis of this exam. Artifact also precludes adequate evaluation for chronic intracranial blood products. No evidence of an intracranial mass. No extra-axial fluid collection. No midline shift. Vascular: Expected proximal arterial flow voids. Skull and upper cervical spine: No focal marrow lesion. Sinuses/Orbits: Visualized orbits show no acute finding. Trace left ethmoid sinus mucosal thickening. IMPRESSION: Due to oral iron supplementation prior to imaging, significant artifact affects multiple sequences. The diffusion-weighted and SWI sequences are most notably affected. There is no evidence of acute infarct. However, artifact limits the reliability of the diffusion-weighted imaging, and an acute infarct cannot be excluded on the basis of this exam. Apparent nidus of abnormal vessels with surrounding gliosis in the right occipital lobe. 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 Redemonstrated Chiari I malformation. Electronically Signed: By: Jackey Loge DO On: 03/15/2021 14:45   DG CHEST PORT 1 VIEW  Result Date: 03/15/2021 CLINICAL DATA:  Transient ischemic attack EXAM: PORTABLE CHEST 1 VIEW COMPARISON:  None. FINDINGS: The heart size and mediastinal contours are within normal limits. Both lungs are clear. The visualized skeletal structures are unremarkable. IMPRESSION: No active disease. Electronically Signed   By: Helyn Numbers MD   On: 03/15/2021 00:44   ECHOCARDIOGRAM COMPLETE  Result Date: 03/15/2021    ECHOCARDIOGRAM REPORT   Patient Name:   Jodi Wood Date of Exam: 03/15/2021 Medical Rec #:  [id number]            Height:       60.0 in Accession #:    [id number]           Weight:       175.0 lb Date of Birth:  [birth year]             BSA:          1.764 m Patient Age:    39 years             BP:           128/79 mmHg Patient Gender: F                    HR:           73 bpm. Exam  Location:  Inpatient Procedure: 2D Echo Indications:    TIA  History:        Patient has no prior history of Echocardiogram examinations.  Sonographer:    Thurman Coyer RDCS (AE) Referring Phys: [id number] ANASTASSIA DOUTOVA IMPRESSIONS  1. Left ventricular ejection fraction, by estimation, is 60 to 65%. The left ventricle has normal function. The left ventricle has no regional wall motion abnormalities. Left ventricular diastolic parameters were normal.  2. Right ventricular systolic function is normal. The right ventricular size is normal. There is normal pulmonary artery systolic pressure.  3. The mitral valve is normal in structure. No evidence of mitral valve regurgitation. No evidence of mitral stenosis.  4. The aortic valve is tricuspid. Aortic valve regurgitation is not visualized. No aortic stenosis is present.  5. The inferior vena cava is normal in size with greater than 50% respiratory variability, suggesting right atrial pressure of 3 mmHg. FINDINGS  Left Ventricle: Left ventricular ejection fraction, by estimation, is 60 to 65%. The left ventricle has normal function. The left ventricle has no regional wall motion abnormalities. The left ventricular internal cavity size was normal in size. There is  no left ventricular hypertrophy. Left ventricular diastolic parameters were normal. Indeterminate filling pressures. Right Ventricle: The right ventricular size is normal. No increase in right ventricular wall thickness. Right ventricular systolic function is normal. There is normal pulmonary artery systolic pressure. The tricuspid regurgitant velocity is 1.56 m/s, and  with an assumed right atrial pressure of 3 mmHg, the estimated right ventricular systolic pressure is 12.7 mmHg. Left Atrium: Left atrial size was normal in size. Right Atrium: Right atrial size was normal in size. Pericardium: There is no evidence of pericardial effusion. Mitral Valve: The mitral valve is normal in structure. No evidence of  mitral valve regurgitation. No evidence of mitral valve stenosis. Tricuspid Valve: The tricuspid valve is normal in structure. Tricuspid valve regurgitation is trivial. No evidence of tricuspid stenosis. Aortic Valve: The aortic valve is tricuspid. Aortic valve regurgitation is not visualized. No aortic stenosis is present. Pulmonic Valve: The pulmonic valve was normal in structure. Pulmonic valve regurgitation is not visualized. No evidence of pulmonic stenosis. Aorta: The aortic root is normal in size and structure. Venous: The inferior vena cava is normal in size with greater than 50% respiratory variability, suggesting right atrial pressure of 3 mmHg. IAS/Shunts: No atrial level shunt detected by color flow Doppler.  LEFT VENTRICLE PLAX 2D LVIDd:         4.20 cm  Diastology LVIDs:         2.50 cm  LV e' medial:    7.40 cm/s LV PW:         0.90 cm  LV E/e' medial:  10.4 LV IVS:        0.90 cm  LV e' lateral:   12.90 cm/s LVOT diam:     2.10 cm  LV E/e' lateral: 5.9 LV SV:         62 LV SV Index:   35 LVOT Area:     3.46 cm  RIGHT VENTRICLE RV S prime:     15.60 cm/s TAPSE (M-mode): 1.3 cm LEFT ATRIUM             Index       RIGHT ATRIUM           Index LA diam:        2.70 cm 1.53 cm/m  RA Area:     14.70 cm LA Vol (A2C):   34.6 ml 19.62 ml/m RA Volume:   36.00 ml  20.41 ml/m LA Vol (A4C):   27.5 ml 15.59 ml/m LA Biplane Vol: 31.4 ml 17.80 ml/m  AORTIC VALVE LVOT Vmax:   96.60 cm/s LVOT Vmean:  59.800 cm/s LVOT VTI:    0.180 m  AORTA Ao Root diam: 3.20 cm MITRAL VALVE               TRICUSPID VALVE MV Area (PHT): 3.03 cm    TR Peak grad:   9.7 mmHg MV Decel Time: 250 msec    TR Vmax:        156.00 cm/s MV E velocity: 76.70 cm/s MV A velocity: 64.30 cm/s  SHUNTS MV E/A ratio:  1.19        Systemic VTI:  0.18 m                            Systemic Diam: 2.10 cm Chilton Si MD Electronically signed by Chilton Si MD Signature Date/Time: 03/15/2021/5:12:50 PM    Final    CT HEAD CODE STROKE WO  CONTRAST  Result Date: 03/14/2021 CLINICAL DATA:  Code stroke. Initial evaluation for acute left-sided visual loss. EXAM: CT HEAD WITHOUT CONTRAST TECHNIQUE: Contiguous axial images were obtained from the base of the skull through the vertex without intravenous contrast. COMPARISON:  None. FINDINGS: Brain: Cerebral volume within normal limits for patient age. No evidence for acute intracranial hemorrhage. No findings to suggest acute large vessel territory infarct. No mass lesion, midline shift, or mass effect. Ventricles are normal in size without evidence for hydrocephalus. No extra-axial fluid collection identified. 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 Vascular: No hyperdense vessel identified. Skull: Scalp soft tissues demonstrate no acute abnormality. Calvarium intact. Sinuses/Orbits: Globes and orbital soft tissues within normal limits. Visualized paranasal sinuses are clear. No mastoid effusion. ASPECTS [facility] Stroke Program Early CT Score) - Ganglionic level infarction (caudate, lentiform nuclei, internal capsule, insula, M1-M3 cortex): 7 - Supraganglionic infarction (M4-M6 cortex): 3 Total score (0-10 with 10 being normal): 10 IMPRESSION: 1. No acute intracranial abnormality. 2. ASPECTS is 10. 3. Chiari 1 malformation. Critical Value/emergent results were called by telephone at the time of interpretation on 03/14/2021 at 9:11 pm to provider [facility] , who verbally acknowledged these results. Electronically Signed   By: Rise Mu M.D.   On: 03/14/2021 21:11   VAS US CAROTID  Result Date: 03/15/2021 Carotid Arterial Duplex Study Patient Name:  Jodi Wood  Date of Exam:   03/15/2021 Medical Rec #: [id number]             Accession #:    [id number] Date of Birth: 1981-06-14              Patient Gender: F Patient Age:   66Y Exam Location:  [facility] Procedure:      VAS US CAROTID Referring Phys: [id number] ANASTASSIA  DOUTOVA --------------------------------------------------------------------------------  Indications:       TIA. Comparison Study:  no prior Performing Technologist: Blanch Media RVS  Examination Guidelines: A complete evaluation includes B-mode imaging, spectral Doppler, color Doppler, and power Doppler as needed of all accessible portions of each vessel. Bilateral testing is considered an integral part of a complete examination. Limited examinations for reoccurring indications may be performed as noted.  Right Carotid Findings: +----------+--------+--------+--------+------------------+--------+           PSV cm/sEDV cm/sStenosisPlaque DescriptionComments +----------+--------+--------+--------+------------------+--------+ CCA Prox  116     25              heterogenous               +----------+--------+--------+--------+------------------+--------+ CCA Distal97      28              heterogenous               +----------+--------+--------+--------+------------------+--------+ ICA Prox  94      34      1-39%   heterogenous               +----------+--------+--------+--------+------------------+--------+ ICA Distal123     42                                         +----------+--------+--------+--------+------------------+--------+ ECA       88      12                                         +----------+--------+--------+--------+------------------+--------+ +----------+--------+-------+--------+-------------------+  PSV cm/sEDV cmsDescribeArm Pressure (mmHG) +----------+--------+-------+--------+-------------------+ [id number]                                         +----------+--------+-------+--------+-------------------+ +---------+--------+--+--------+--+---------+ VertebralPSV cm/s55EDV cm/s10Antegrade +---------+--------+--+--------+--+---------+  Left Carotid Findings: +----------+--------+--------+--------+------------------+--------+            PSV cm/sEDV cm/sStenosisPlaque DescriptionComments +----------+--------+--------+--------+------------------+--------+ CCA Prox  133     28              heterogenous               +----------+--------+--------+--------+------------------+--------+ CCA Distal83      21              heterogenous               +----------+--------+--------+--------+------------------+--------+ ICA Prox  81      33      1-39%   heterogenous               +----------+--------+--------+--------+------------------+--------+ ICA Distal139     53                                         +----------+--------+--------+--------+------------------+--------+ ECA       75      16                                         +----------+--------+--------+--------+------------------+--------+ +----------+--------+--------+--------+-------------------+           PSV cm/sEDV cm/sDescribeArm Pressure (mmHG) +----------+--------+--------+--------+-------------------+ [id number]                                          +----------+--------+--------+--------+-------------------+ +---------+--------+--+--------+--+---------+ VertebralPSV cm/s49EDV cm/s23Antegrade +---------+--------+--+--------+--+---------+   Summary: Right Carotid: Velocities in the right ICA are consistent with a 1-39% stenosis. Left Carotid: Velocities in the left ICA are consistent with a 1-39% stenosis. Vertebrals: Bilateral vertebral arteries demonstrate antegrade flow. *See table(s) above for measurements and observations.  Electronically signed by Delia HeadyPramod Sethi MD on 03/15/2021 at 1:18:53 PM.    Final     Microbiology: Recent Results (from the past 240 hour(s))  Resp Panel by RT-PCR (Flu A&B, Covid) Nasopharyngeal Swab     Status: None   Collection Time: 03/14/21  8:45 PM   Specimen: Nasopharyngeal Swab; Nasopharyngeal(NP) swabs in vial transport medium  Result Value Ref Range Status   SARS Coronavirus 2 by RT  PCR NEGATIVE NEGATIVE Final    Comment: (NOTE) SARS-CoV-2 target nucleic acids are NOT DETECTED.  The SARS-CoV-2 RNA is generally detectable in upper respiratory specimens during the acute phase of infection. The lowest concentration of SARS-CoV-2 viral copies this assay can detect is 138 copies/mL. A negative result does not preclude SARS-Cov-2 infection and should not be used as the sole basis for treatment or other patient management decisions. A negative result may occur with  improper specimen collection/handling, submission of specimen other than nasopharyngeal swab, presence of viral mutation(s) within the areas targeted by this assay, and inadequate number of viral copies(<138 copies/mL). A negative result must be combined with clinical observations, patient history, and epidemiological information. The expected result is Negative.  Fact Sheet for  Patients:  BloggerCourse.com  Fact Sheet for Healthcare Providers:  SeriousBroker.it  This test is no t yet approved or cleared by the [place] FDA and  has been authorized for detection and/or diagnosis of SARS-CoV-2 by FDA under an Emergency Use Authorization (EUA). This EUA will remain  in effect (meaning this test can be used) for the duration of the COVID-19 declaration under Section 564(b)(1) of the Act, 21 U.S.C.section 360bbb-3(b)(1), unless the authorization is terminated  or revoked sooner.       Influenza A by PCR NEGATIVE NEGATIVE Final   Influenza B by PCR NEGATIVE NEGATIVE Final    Comment: (NOTE) The Xpert Xpress SARS-CoV-2/FLU/RSV plus assay is intended as an aid in the diagnosis of influenza from Nasopharyngeal swab specimens and should not be used as a sole basis for treatment. Nasal washings and aspirates are unacceptable for Xpert Xpress SARS-CoV-2/FLU/RSV testing.  Fact Sheet for Patients: BloggerCourse.com  Fact Sheet for  Healthcare Providers: SeriousBroker.it  This test is not yet approved or cleared by the [place] FDA and has been authorized for detection and/or diagnosis of SARS-CoV-2 by FDA under an Emergency Use Authorization (EUA). This EUA will remain in effect (meaning this test can be used) for the duration of the COVID-19 declaration under Section 564(b)(1) of the Act, 21 U.S.C. section 360bbb-3(b)(1), unless the authorization is terminated or revoked.  Performed at [facility], [address] [place], [place] [address]      Labs: Basic Metabolic Panel: Recent Labs  Lab 03/14/21 2045  NA 138  K 3.4*  CL 105  CO2 21*  GLUCOSE 95  BUN 8  CREATININE 0.79  CALCIUM 9.4   Liver Function Tests: Recent Labs  Lab 03/14/21 2045  AST 19  ALT 16  ALKPHOS 59  BILITOT 0.3  PROT 7.8  ALBUMIN 4.5   No results for input(s): LIPASE, AMYLASE in the last 168 hours. No results for input(s): AMMONIA in the last 168 hours. CBC: Recent Labs  Lab 03/14/21 2045  WBC 6.7  NEUTROABS 2.7  HGB 9.2*  HCT 31.1*  MCV 57.4*  PLT 437*   Cardiac Enzymes: Recent Labs  Lab 03/15/21 0837  CKTOTAL 76   BNP: BNP (last 3 results) No results for input(s): BNP in the last 8760 hours.  ProBNP (last 3 results) No results for input(s): PROBNP in the last 8760 hours.  CBG: No results for input(s): GLUCAP in the last 168 hours.     Signed:  Briant Cedar, MD [facility] 03/15/2021, 9:07 PM

## 2021-03-15 NOTE — Consult Note (Signed)
Neurology Consultation  Reason for Consult: AVM on imaging with transient left hemianopsia Referring Physician: Dr. Sharolyn Douglas  CC: Transient left hemianopsia, headache  History is obtained from: Patient, Chart review  HPI: Jodi Wood is a 40 y.o. 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 She states that the episode of vision loss lasted approximately 45 minutes followed by a right-sided headache 7/10 in intensity involving the right eye and face. She states that her headache lasted 2-3 hours before resolution. She has a family member in the medical field who advised her to come to the ER after hearing her symptoms. She denies limb weakness, sensory changes, or changes in speech. She states that she did not look in the mirror to visualize facial weakness or drooping during this episode. 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 Patient is completely asymptomatic and BP well-controlled without medication since admission <140.   ROS: A complete ROS was performed and is negative except as noted in the HPI.   Past Medical History: Anemia 2/2 menorrhagia  Family History  Adopted: Yes   Social History:   reports that she has never smoked. She has never used smokeless tobacco. She reports previous alcohol use. She reports that she does not use drugs.  Medications  Current Facility-Administered Medications:  .   stroke: mapping our early stages of recovery book, , Does not apply, Once, Doutova, Anastassia, MD .  0.9 %  sodium chloride infusion, , Intravenous, Continuous, Doutova, Anastassia, MD, Last Rate: 75 mL/hr at 03/15/21 0846, New Bag at 03/15/21 0846 .  acetaminophen (TYLENOL)  tablet 650 mg, 650 mg, Oral, Q4H PRN, 650 mg at 03/15/21 0830 **OR** acetaminophen (TYLENOL) 160 MG/5ML solution 650 mg, 650 mg, Per Tube, Q4H PRN **OR** acetaminophen (TYLENOL) suppository 650 mg, 650 mg, Rectal, Q4H PRN, Therisa Doyne, MD .  Melene Muller ON 03/16/2021] vitamin B-12 (CYANOCOBALAMIN) tablet 1,000 mcg, 1,000 mcg, Oral, Daily, Sharolyn Douglas, Monica Martinez, MD  Current Outpatient Medications:  .  ferrous sulfate 325 (65 FE) MG EC tablet, Take 1 tablet (325 mg total) by mouth daily with breakfast., Disp: 30 tablet, Rfl: 0 .  Multiple Vitamin (MULTIVITAMIN WITH MINERALS) TABS tablet, Take 1 tablet by mouth daily., Disp: , Rfl:  .  ibuprofen (ADVIL) 200 MG tablet, Take 2 tablets (400 mg total) by mouth every 8 (eight) hours as needed for mild pain., Disp: 30 tablet, Rfl:  .  [START ON 03/16/2021] vitamin B-12 1000 MCG tablet, Take 1 tablet (1,000 mcg total) by mouth daily., Disp: 30 tablet, Rfl: 0  Exam: Current vital signs: BP 130/80 (BP Location: Left Arm)   Pulse 90   Temp 97.9 F (36.6 C) (Oral)   Resp 16   Ht 5' (1.524 m)   Wt 79.4 kg   LMP  (LMP Unknown)   SpO2 100%   BMI 34.18 kg/m  Vital signs in last 24 hours: Temp:  [97.9 F (36.6 C)-98.4 F (36.9 C)] 97.9 F (36.6 C) (04/28 2035) Pulse Rate:  [66-94] 90 (04/29 1643) Resp:  [15-21] 16 (04/29 1643) BP: (111-154)/(65-88) 130/80 (04/29 1643) SpO2:  [97 %-100 %] 100 % (04/29 1643) Weight:  [79.4 kg] 79.4 kg (04/28 2019)  GENERAL: Awake, alert, in  no acute distress Psych: Affect appropriate for situation, calm and cooperative with examination Head: Normocephalic and atraumatic, without obvious abnormality EENT: Normal conjunctivae, no OP obstruction LUNGS: Normal respiratory effort. Non-labored breathing CV: Regular rate on tele ABDOMEN: Soft, non-tender, non-distended Ext: warm, well perfused, no obvious deformity  NEURO:  Mental Status: Awake, alert, and oriented to person, place, month, age, and  situation Speech/Language: speech is intact without dysarthria or aphasia.  Naming, repetition, fluency, and comprehension intact. She is able to provide a clear and coherent history of present illness. No neglect is noted.  Cranial Nerves:  II: PERRL 5 mm/brisk. Visual fields full.  III, IV, VI: EOMI. Lid elevation symmetric and full.  V: Sensation is intact to light touch and symmetrical to face.  VII: Face is symmetric resting and smiling. Able to puff cheeks and raise eyebrows.  VIII: Hearing is intact to voice IX, X: Palate elevation is symmetric. Phonation normal.  XI: Normal sternocleidomastoid and trapezius muscle strength XII: Tongue protrudes midline without fasciculations.   Motor: 5/5 strength is all muscle groups without vertical drift on assessment. Tone and bulk are normal.  Sensation: Intact to light touch bilaterally in all four extremities. No extinction to DSS present.  Coordination: FTN intact bilaterally. HKS intact bilaterally. No pronator drift.   DTRs: 2+ and symmetric biceps and patellae  Gait: deferred  Labs I have reviewed labs in epic and the results pertinent to this consultation are: CBC    Component Value Date/Time   WBC 6.7 03/14/2021 2045   RBC 5.14 (H) 03/14/2021 2357   RBC 5.42 (H) 03/14/2021 2045   HGB 9.2 (L) 03/14/2021 2045   HCT 31.1 (L) 03/14/2021 2045   PLT 437 (H) 03/14/2021 2045   MCV 57.4 (L) 03/14/2021 2045   MCH 17.0 (L) 03/14/2021 2045   MCHC 29.6 (L) 03/14/2021 2045   RDW 21.1 (H) 03/14/2021 2045   LYMPHSABS 3.1 03/14/2021 2045   MONOABS 0.6 03/14/2021 2045   EOSABS 0.2 03/14/2021 2045   BASOSABS 0.0 03/14/2021 2045   CMP     Component Value Date/Time   NA 138 03/14/2021 2045   K 3.4 (L) 03/14/2021 2045   CL 105 03/14/2021 2045   CO2 21 (L) 03/14/2021 2045   GLUCOSE 95 03/14/2021 2045   BUN 8 03/14/2021 2045   CREATININE 0.79 03/14/2021 2045   CALCIUM 9.4 03/14/2021 2045   PROT 7.8 03/14/2021 2045   ALBUMIN 4.5  03/14/2021 2045   AST 19 03/14/2021 2045   ALT 16 03/14/2021 2045   ALKPHOS 59 03/14/2021 2045   BILITOT 0.3 03/14/2021 2045   GFRNONAA >60 03/14/2021 2045   Lipid Panel     Component Value Date/Time   CHOL 142 03/15/2021 0837   TRIG 40 03/15/2021 0837   HDL 51 03/15/2021 0837   CHOLHDL 2.8 03/15/2021 0837   VLDL 8 03/15/2021 0837   LDLCALC 83 03/15/2021 0837   Lab Results  Component Value Date   HGBA1C 5.6 03/15/2021   Imaging I have reviewed the images obtained: CT-scan of the brain: IMPRESSION: 1. No acute intracranial abnormality. 2. ASPECTS is 10. 3. Chiari 1 malformation.  MRI examination of the brain: IMPRESSION: Due to oral iron supplementation prior to imaging, significant artifact affects multiple sequences. The diffusion-weighted and SWI sequences are most notably affected. There is no evidence of acute infarct. However, artifact limits the reliability of the diffusion-weighted imaging, and an acute infarct cannot be excluded on the basis of this exam. Apparent nidus of abnormal  vessels with surrounding gliosis in the right occipital lobe. 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 Redemonstrated Chiari I malformation.  CTA neck: 1. The common carotid, internal carotid and vertebral arteries are patent within the neck without stenosis or significant atherosclerotic disease. 2. Multiple thyroid nodules, the largest measuring 1.5 cm within the right lobe. Nonemergent thyroid ultrasound recommended for further evaluation.  CTA head: 1. Nidus of abnormal arterially enhancing vessels within the right occipital lobe compatible with arteriovenous malformation (AVM). Given its location, this AVM is likely at least partially fed by the right posterior cerebral artery. Neurointerventional consultation is recommended, and catheter-based angiography should be considered for further evaluation. 2.  No intracranial large vessel occlusion or proximal high-grade arterial stenosis.  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  - Neurologic exam currently completely normal - AVM is unruptured. Discussed with IR, it is reasonable without persistent or further deficits to schedule catheter angiogram as an outpatient procedure with strict return precautions for further vision disturbance or headache.   I will contact Dr. Gilmer Mor with neuro IR to arrange this procedure next week as an outpatient. OK to discharge home with strict return precautions.    Bing Neighbors, MD [facility] [phone]  If 7pm- 7am, please page neurology on call as listed in AMION.

## 2021-03-15 NOTE — ED Notes (Signed)
Patient expressed a desire to follow up outpatient vs admission. MD made aware. Patient was informed that the MD wanted to see the results of the MRI before discharging the patient. Until that time the patient is holding in the ED. Receiving RN made aware.

## 2021-03-18 ENCOUNTER — Telehealth (HOSPITAL_COMMUNITY): Payer: Self-pay

## 2021-03-18 ENCOUNTER — Other Ambulatory Visit (HOSPITAL_COMMUNITY): Payer: Self-pay | Admitting: Neuroradiology

## 2021-03-18 DIAGNOSIS — Q2730 Arteriovenous malformation, site unspecified: Secondary | ICD-10-CM

## 2021-03-18 NOTE — Telephone Encounter (Signed)
Called to schedule consult, no answer, left vm. AW  

## 2021-03-19 ENCOUNTER — Other Ambulatory Visit: Payer: Self-pay

## 2021-03-19 ENCOUNTER — Ambulatory Visit (HOSPITAL_COMMUNITY)
Admission: RE | Admit: 2021-03-19 | Discharge: 2021-03-19 | Disposition: A | Discharge status: Home / Self Care | Payer: Medicaid Other | Source: Ambulatory Visit | Attending: Neuroradiology | Admitting: Neuroradiology

## 2021-03-19 DIAGNOSIS — Q2730 Arteriovenous malformation, site unspecified: Secondary | ICD-10-CM

## 2021-03-19 NOTE — Consult Note (Signed)
Chief Complaint: Patient was seen in consultation today for right occipital AVM.  Referring Physician(s): Bing Neighbors MD  Supervising Physician: Joana Reamer Kizzie Fantasia  Patient Status: [facility] - Out-pt  History of Present Illness: Jodi Wood is a 40 year old female with a past medical history significant for anemia who presented to the emergency department on 03/14/21 complaining of transient left hemianopsia with acute onset while driving and lasting approximately 45 minutes.  This was followed by moderate right-sided headache.  She denies weakness or sensory changes.  She refers a similar episode about a year ago.  A head CT was obtained which was negative for acute infarct or hemorrhage.  Brain MRI was performed and showed prominent vessels in the right occipital lobe concern for possible AVM.  CT angiogram of the head and neck was also concerning for a right occipital lobe AVM.  She is currently asymptomatic.    No past medical history on file.  Past Surgical History:  Procedure Laterality Date  . CESAREAN SECTION      Allergies: Patient has no known allergies.  Medications: Prior to Admission medications   Medication Sig Start Date End Date Taking? Authorizing Provider  ferrous sulfate 325 (65 FE) MG EC tablet Take 1 tablet (325 mg total) by mouth daily with breakfast. 03/15/21 04/14/21  Briant Cedar, MD  ibuprofen (ADVIL) 200 MG tablet Take 2 tablets (400 mg total) by mouth every 8 (eight) hours as needed for mild pain. 03/15/21   Briant Cedar, MD  Multiple Vitamin (MULTIVITAMIN WITH MINERALS) TABS tablet Take 1 tablet by mouth daily.    [provider]  vitamin B-12 1000 MCG tablet Take 1 tablet (1,000 mcg total) by mouth daily. 03/16/21 04/15/21  Briant Cedar, MD  pantoprazole (PROTONIX) 20 MG tablet Take 1 tablet (20 mg total) by mouth daily. Patient not taking: Reported on 02/21/2020 11/29/19 02/21/20  Hall-Potvin, [place], PA-C      Family History  Adopted: Yes    Social History   Socioeconomic History  . Marital status: Married    Spouse name: Not on file  . Number of children: Not on file  . Years of education: Not on file  . Highest education level: Not on file  Occupational History  . Not on file  Tobacco Use  . Smoking status: Never Smoker  . Smokeless tobacco: Never Used  Substance and Sexual Activity  . Alcohol use: Not Currently    Comment: 3 x a year  . Drug use: Never  . Sexual activity: Not on file  Other Topics Concern  . Not on file  Social History Narrative  . Not on file   Social Determinants of Health   Financial Resource Strain: Not on file  Food Insecurity: Not on file  Transportation Needs: Not on file  Physical Activity: Not on file  Stress: Not on file  Social Connections: Not on file     Review of Systems: A 12 point ROS discussed and pertinent positives are indicated in the HPI above.  All other systems are negative.  Review of Systems  Vital Signs: LMP 03/15/2021   Physical Exam Constitutional:      General: She is not in acute distress. Eyes:     General: No visual field deficit. Neurological:     General: No focal deficit present.     Mental Status: She is alert and oriented to person, place, and time. Mental status is at baseline.  Cranial Nerves: Cranial nerves are intact.     Sensory: Sensation is intact.     Motor: Motor function is intact.          Imaging: CT ANGIO HEAD W OR WO CONTRAST  Result Date: 03/15/2021 CLINICAL DATA:  Transient ischemic attack (TIA). Additional history provided: Transient left-sided vision loss small driving. EXAM: CT ANGIOGRAPHY HEAD AND NECK TECHNIQUE: Multidetector CT imaging of the head and neck was performed using the standard protocol during bolus administration of intravenous contrast. Multiplanar CT image reconstructions and MIPs were obtained to evaluate the vascular anatomy. Carotid stenosis measurements  (when applicable) are obtained utilizing NASCET criteria, using the distal internal carotid diameter as the denominator. CONTRAST:  38mL OMNIPAQUE IOHEXOL 350 MG/ML SOLN COMPARISON:  Brain MRI performed earlier today 03/15/2021. Noncontrast head CT performed earlier today 03/14/2021. FINDINGS: CTA NECK FINDINGS Aortic arch: Common origin of the innominate and left common carotid arteries. The visualized aortic arch is normal in caliber. No hemodynamically significant innominate or proximal subclavian artery stenosis. Right carotid system: CCA and ICA patent within the neck without stenosis. No significant atherosclerotic disease. Left carotid system: CCA and ICA patent within the neck without stenosis. No significant atherosclerotic disease. Vertebral arteries: Vertebral arteries patent within the neck without stenosis. Left vertebral artery dominant. Skeleton: No acute bony abnormality or aggressive osseous lesion. Other neck: No cervical lymphadenopathy. Multiple thyroid nodules, the largest within the right lobe measuring 1.5 cm. Upper chest: No consolidation within the imaged lung apices. Review of the MIP images confirms the above findings CTA HEAD FINDINGS Anterior circulation: The intracranial internal carotid arteries are patent. The M1 middle cerebral arteries are patent. No M2 proximal branch occlusion or high-grade proximal stenosis is identified. The anterior cerebral arteries are patent. No intracranial aneurysm is identified. Posterior circulation: The intracranial vertebral arteries are patent. The basilar artery is patent. The posterior cerebral arteries are patent. Posterior communicating arteries are hypoplastic or absent bilaterally. There is a nidus of abnormal arterial enhancing vessels within the right occipital lobe compatible with an arteriovenous malformation (AVM) (for instance as seen on series 7, image 239) (series 10, image 49). Given its location, this AVM is likely at least partially  fed by the right posterior cerebral artery. Venous sinuses: Within the limitations of contrast timing, no convincing thrombus. Anatomic variants: As described. Review of the MIP images confirms the above findings IMPRESSION: CTA neck: 1. The common carotid, internal carotid and vertebral arteries are patent within the neck without stenosis or significant atherosclerotic disease. 2. Multiple thyroid nodules, the largest measuring 1.5 cm within the right lobe. Nonemergent thyroid ultrasound recommended for further evaluation. CTA head: 1. Nidus of abnormal arterially enhancing vessels within the right occipital lobe compatible with arteriovenous malformation (AVM). Given its location, this AVM is likely at least partially fed by the right posterior cerebral artery. Neurointerventional consultation is recommended, and catheter-based angiography should be considered for further evaluation. 2. No intracranial large vessel occlusion or proximal high-grade arterial stenosis. Electronically Signed   By: Jackey Loge DO   On: 03/15/2021 17:21   CT ANGIO NECK W OR WO CONTRAST  Result Date: 03/15/2021 CLINICAL DATA:  Transient ischemic attack (TIA). Additional history provided: Transient left-sided vision loss small driving. EXAM: CT ANGIOGRAPHY HEAD AND NECK TECHNIQUE: Multidetector CT imaging of the head and neck was performed using the standard protocol during bolus administration of intravenous contrast. Multiplanar CT image reconstructions and MIPs were obtained to evaluate the vascular anatomy. Carotid stenosis measurements (when applicable)  are obtained utilizing NASCET criteria, using the distal internal carotid diameter as the denominator. CONTRAST:  75mL OMNIPAQUE IOHEXOL 350 MG/ML SOLN COMPARISON:  Brain MRI performed earlier today 03/15/2021. Noncontrast head CT performed earlier today 03/14/2021. FINDINGS: CTA NECK FINDINGS Aortic arch: Common origin of the innominate and left common carotid arteries. The  visualized aortic arch is normal in caliber. No hemodynamically significant innominate or proximal subclavian artery stenosis. Right carotid system: CCA and ICA patent within the neck without stenosis. No significant atherosclerotic disease. Left carotid system: CCA and ICA patent within the neck without stenosis. No significant atherosclerotic disease. Vertebral arteries: Vertebral arteries patent within the neck without stenosis. Left vertebral artery dominant. Skeleton: No acute bony abnormality or aggressive osseous lesion. Other neck: No cervical lymphadenopathy. Multiple thyroid nodules, the largest within the right lobe measuring 1.5 cm. Upper chest: No consolidation within the imaged lung apices. Review of the MIP images confirms the above findings CTA HEAD FINDINGS Anterior circulation: The intracranial internal carotid arteries are patent. The M1 middle cerebral arteries are patent. No M2 proximal branch occlusion or high-grade proximal stenosis is identified. The anterior cerebral arteries are patent. No intracranial aneurysm is identified. Posterior circulation: The intracranial vertebral arteries are patent. The basilar artery is patent. The posterior cerebral arteries are patent. Posterior communicating arteries are hypoplastic or absent bilaterally. There is a nidus of abnormal arterial enhancing vessels within the right occipital lobe compatible with an arteriovenous malformation (AVM) (for instance as seen on series 7, image 239) (series 10, image 49). Given its location, this AVM is likely at least partially fed by the right posterior cerebral artery. Venous sinuses: Within the limitations of contrast timing, no convincing thrombus. Anatomic variants: As described. Review of the MIP images confirms the above findings IMPRESSION: CTA neck: 1. The common carotid, internal carotid and vertebral arteries are patent within the neck without stenosis or significant atherosclerotic disease. 2. Multiple  thyroid nodules, the largest measuring 1.5 cm within the right lobe. Nonemergent thyroid ultrasound recommended for further evaluation. CTA head: 1. Nidus of abnormal arterially enhancing vessels within the right occipital lobe compatible with arteriovenous malformation (AVM). Given its location, this AVM is likely at least partially fed by the right posterior cerebral artery. Neurointerventional consultation is recommended, and catheter-based angiography should be considered for further evaluation. 2. No intracranial large vessel occlusion or proximal high-grade arterial stenosis. Electronically Signed   By: Jackey Loge DO   On: 03/15/2021 17:21   MR BRAIN WO CONTRAST  Addendum Date: 03/15/2021   ADDENDUM REPORT: 03/15/2021 15:05 ADDENDUM: Limitations, findings and recommendations called by telephone at the time of interpretation on 03/15/2021 at 3:04 pm to provider Dr Cristal Generous, who verbally acknowledged these results. Electronically Signed   By: Jackey Loge DO   On: 03/15/2021 15:05   Result Date: 03/15/2021 CLINICAL DATA:  Neuro deficit, acute, stroke suspected. Additional history provided: Episode of left sided vision loss last night while driving, vision now back to normal, left-sided weakness. EXAM: MRI HEAD WITHOUT CONTRAST TECHNIQUE: Multiplanar, multiecho pulse sequences of the brain and surrounding structures were obtained without intravenous contrast. COMPARISON:  Noncontrast head CT 03/14/2021. FINDINGS: Due to oral iron supplementation prior to imaging, significant artifact affects multiple sequences. The diffusion-weighted and SWI sequences are most notably affected. Brain: Cerebral volume is normal. There is an apparent nidus of abnormal vessels within the right occipital lobe with subtle surrounding gliosis (for instance as seen on series 9, image 25). Chiari I malformation. The cerebellar tonsils extend  15 mm below the foramen magnum. Associated crowding at the level of the foramen  magnum. Partially empty sella turcica. There is no evidence of acute infarct. However, artifact limits the reliability of the diffusion-weighted sequences, and an acute infarct cannot be excluded on the basis of this exam. Artifact also precludes adequate evaluation for chronic intracranial blood products. No evidence of an intracranial mass. No extra-axial fluid collection. No midline shift. Vascular: Expected proximal arterial flow voids. Skull and upper cervical spine: No focal marrow lesion. Sinuses/Orbits: Visualized orbits show no acute finding. Trace left ethmoid sinus mucosal thickening. IMPRESSION: Due to oral iron supplementation prior to imaging, significant artifact affects multiple sequences. The diffusion-weighted and SWI sequences are most notably affected. There is no evidence of acute infarct. However, artifact limits the reliability of the diffusion-weighted imaging, and an acute infarct cannot be excluded on the basis of this exam. Apparent nidus of abnormal vessels with surrounding gliosis in the right occipital lobe. 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 Redemonstrated Chiari I malformation. Electronically Signed: By: Jackey Loge DO On: 03/15/2021 14:45   DG CHEST PORT 1 VIEW  Result Date: 03/15/2021 CLINICAL DATA:  Transient ischemic attack EXAM: PORTABLE CHEST 1 VIEW COMPARISON:  None. FINDINGS: The heart size and mediastinal contours are within normal limits. Both lungs are clear. The visualized skeletal structures are unremarkable. IMPRESSION: No active disease. Electronically Signed   By: Helyn Numbers MD   On: 03/15/2021 00:44   ECHOCARDIOGRAM COMPLETE  Result Date: 03/15/2021    ECHOCARDIOGRAM REPORT   Patient Name:   [facility] Date of Exam: 03/15/2021 Medical Rec #:  [id number]            Height:       60.0 in Accession #:    [id number]           Weight:       175.0 lb Date of Birth:   1981-10-24             BSA:          1.764 m Patient Age:    39 years             BP:           128/79 mmHg Patient Gender: F                    HR:           73 bpm. Exam Location:  Inpatient Procedure: 2D Echo Indications:    TIA  History:        Patient has no prior history of Echocardiogram examinations.  Sonographer:    Thurman Coyer RDCS (AE) Referring Phys: [id number] ANASTASSIA DOUTOVA IMPRESSIONS  1. Left ventricular ejection fraction, by estimation, is 60 to 65%. The left ventricle has normal function. The left ventricle has no regional wall motion abnormalities. Left ventricular diastolic parameters were normal.  2. Right ventricular systolic function is normal. The right ventricular size is normal. There is normal pulmonary artery systolic pressure.  3. The mitral valve is normal in structure. No evidence of mitral valve regurgitation. No evidence of mitral stenosis.  4. The aortic valve is tricuspid. Aortic valve regurgitation is not visualized. No aortic stenosis is present.  5. The inferior vena cava is normal in size with greater than 50% respiratory variability, suggesting right atrial pressure of 3 mmHg. FINDINGS  Left Ventricle: Left ventricular ejection fraction,  by estimation, is 60 to 65%. The left ventricle has normal function. The left ventricle has no regional wall motion abnormalities. The left ventricular internal cavity size was normal in size. There is  no left ventricular hypertrophy. Left ventricular diastolic parameters were normal. Indeterminate filling pressures. Right Ventricle: The right ventricular size is normal. No increase in right ventricular wall thickness. Right ventricular systolic function is normal. There is normal pulmonary artery systolic pressure. The tricuspid regurgitant velocity is 1.56 m/s, and  with an assumed right atrial pressure of 3 mmHg, the estimated right ventricular systolic pressure is 12.7 mmHg. Left Atrium: Left atrial size was normal in size. Right  Atrium: Right atrial size was normal in size. Pericardium: There is no evidence of pericardial effusion. Mitral Valve: The mitral valve is normal in structure. No evidence of mitral valve regurgitation. No evidence of mitral valve stenosis. Tricuspid Valve: The tricuspid valve is normal in structure. Tricuspid valve regurgitation is trivial. No evidence of tricuspid stenosis. Aortic Valve: The aortic valve is tricuspid. Aortic valve regurgitation is not visualized. No aortic stenosis is present. Pulmonic Valve: The pulmonic valve was normal in structure. Pulmonic valve regurgitation is not visualized. No evidence of pulmonic stenosis. Aorta: The aortic root is normal in size and structure. Venous: The inferior vena cava is normal in size with greater than 50% respiratory variability, suggesting right atrial pressure of 3 mmHg. IAS/Shunts: No atrial level shunt detected by color flow Doppler.  LEFT VENTRICLE PLAX 2D LVIDd:         4.20 cm  Diastology LVIDs:         2.50 cm  LV e' medial:    7.40 cm/s LV PW:         0.90 cm  LV E/e' medial:  10.4 LV IVS:        0.90 cm  LV e' lateral:   12.90 cm/s LVOT diam:     2.10 cm  LV E/e' lateral: 5.9 LV SV:         62 LV SV Index:   35 LVOT Area:     3.46 cm  RIGHT VENTRICLE RV S prime:     15.60 cm/s TAPSE (M-mode): 1.3 cm LEFT ATRIUM             Index       RIGHT ATRIUM           Index LA diam:        2.70 cm 1.53 cm/m  RA Area:     14.70 cm LA Vol (A2C):   34.6 ml 19.62 ml/m RA Volume:   36.00 ml  20.41 ml/m LA Vol (A4C):   27.5 ml 15.59 ml/m LA Biplane Vol: 31.4 ml 17.80 ml/m  AORTIC VALVE LVOT Vmax:   96.60 cm/s LVOT Vmean:  59.800 cm/s LVOT VTI:    0.180 m  AORTA Ao Root diam: 3.20 cm MITRAL VALVE               TRICUSPID VALVE MV Area (PHT): 3.03 cm    TR Peak grad:   9.7 mmHg MV Decel Time: 250 msec    TR Vmax:        156.00 cm/s MV E velocity: 76.70 cm/s MV A velocity: 64.30 cm/s  SHUNTS MV E/A ratio:  1.19        Systemic VTI:  0.18 m  Systemic Diam: 2.10 cm Chilton Si MD Electronically signed by Chilton Si MD Signature Date/Time: 03/15/2021/5:12:50 PM    Final    CT HEAD CODE STROKE WO CONTRAST  Result Date: 03/14/2021 CLINICAL DATA:  Code stroke. Initial evaluation for acute left-sided visual loss. EXAM: CT HEAD WITHOUT CONTRAST TECHNIQUE: Contiguous axial images were obtained from the base of the skull through the vertex without intravenous contrast. COMPARISON:  None. FINDINGS: Brain: Cerebral volume within normal limits for patient age. No evidence for acute intracranial hemorrhage. No findings to suggest acute large vessel territory infarct. No mass lesion, midline shift, or mass effect. Ventricles are normal in size without evidence for hydrocephalus. No extra-axial fluid collection identified. 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 Vascular: No hyperdense vessel identified. Skull: Scalp soft tissues demonstrate no acute abnormality. Calvarium intact. Sinuses/Orbits: Globes and orbital soft tissues within normal limits. Visualized paranasal sinuses are clear. No mastoid effusion. ASPECTS [facility] Stroke Program Early CT Score) - Ganglionic level infarction (caudate, lentiform nuclei, internal capsule, insula, M1-M3 cortex): 7 - Supraganglionic infarction (M4-M6 cortex): 3 Total score (0-10 with 10 being normal): 10 IMPRESSION: 1. No acute intracranial abnormality. 2. ASPECTS is 10. 3. Chiari 1 malformation. Critical Value/emergent results were called by telephone at the time of interpretation on 03/14/2021 at 9:11 pm to provider [facility] , who verbally acknowledged these results. Electronically Signed   By: Rise Mu M.D.   On: 03/14/2021 21:11   VAS US CAROTID  Result Date: 03/15/2021 Carotid Arterial Duplex Study Patient Name:  JASKIRAT ZERTUCHE Appling  Date of Exam:   03/15/2021 Medical Rec #: [id number]             Accession #:    [id number] Date of  Birth: 1981-01-31              Patient Gender: F Patient Age:   51Y Exam Location:  [facility] Procedure:      VAS US CAROTID Referring Phys: [id number] ANASTASSIA DOUTOVA --------------------------------------------------------------------------------  Indications:       TIA. Comparison Study:  no prior Performing Technologist: Blanch Media RVS  Examination Guidelines: A complete evaluation includes B-mode imaging, spectral Doppler, color Doppler, and power Doppler as needed of all accessible portions of each vessel. Bilateral testing is considered an integral part of a complete examination. Limited examinations for reoccurring indications may be performed as noted.  Right Carotid Findings: +----------+--------+--------+--------+------------------+--------+           PSV cm/sEDV cm/sStenosisPlaque DescriptionComments +----------+--------+--------+--------+------------------+--------+ CCA Prox  116     25              heterogenous               +----------+--------+--------+--------+------------------+--------+ CCA Distal97      28              heterogenous               +----------+--------+--------+--------+------------------+--------+ ICA Prox  94      34      1-39%   heterogenous               +----------+--------+--------+--------+------------------+--------+ ICA Distal123     42                                         +----------+--------+--------+--------+------------------+--------+ ECA       88  12                                         +----------+--------+--------+--------+------------------+--------+ +----------+--------+-------+--------+-------------------+           PSV cm/sEDV cmsDescribeArm Pressure (mmHG) +----------+--------+-------+--------+-------------------+ [id number]                                         +----------+--------+-------+--------+-------------------+ +---------+--------+--+--------+--+---------+ VertebralPSV  cm/s55EDV cm/s10Antegrade +---------+--------+--+--------+--+---------+  Left Carotid Findings: +----------+--------+--------+--------+------------------+--------+           PSV cm/sEDV cm/sStenosisPlaque DescriptionComments +----------+--------+--------+--------+------------------+--------+ CCA Prox  133     28              heterogenous               +----------+--------+--------+--------+------------------+--------+ CCA Distal83      21              heterogenous               +----------+--------+--------+--------+------------------+--------+ ICA Prox  81      33      1-39%   heterogenous               +----------+--------+--------+--------+------------------+--------+ ICA Distal139     53                                         +----------+--------+--------+--------+------------------+--------+ ECA       75      16                                         +----------+--------+--------+--------+------------------+--------+ +----------+--------+--------+--------+-------------------+           PSV cm/sEDV cm/sDescribeArm Pressure (mmHG) +----------+--------+--------+--------+-------------------+ [id number]                                          +----------+--------+--------+--------+-------------------+ +---------+--------+--+--------+--+---------+ VertebralPSV cm/s49EDV cm/s23Antegrade +---------+--------+--+--------+--+---------+   Summary: Right Carotid: Velocities in the right ICA are consistent with a 1-39% stenosis. Left Carotid: Velocities in the left ICA are consistent with a 1-39% stenosis. Vertebrals: Bilateral vertebral arteries demonstrate antegrade flow. *See table(s) above for measurements and observations.  Electronically signed by Delia Heady MD on 03/15/2021 at 1:18:53 PM.    Final     Labs:  CBC: Recent Labs    03/14/21 2045  WBC 6.7  HGB 9.2*  HCT 31.1*  PLT 437*    COAGS: Recent Labs    03/14/21 2045  INR 0.9   APTT 31    BMP: Recent Labs    03/14/21 2045  NA 138  K 3.4*  CL 105  CO2 21*  GLUCOSE 95  BUN 8  CALCIUM 9.4  CREATININE 0.79  GFRNONAA >60    LIVER FUNCTION TESTS: Recent Labs    03/14/21 2045  BILITOT 0.3  AST 19  ALT 16  ALKPHOS 59  PROT 7.8  ALBUMIN 4.5    TUMOR MARKERS: No results for input(s): AFPTM, CEA, CA199, CHROMGRNA in the last 8760 hours.  Assessment and Plan:  I  reviewed Mrs. Obenchain' images including head CT, CT angiogram of the head and neck and brain MRI and agree with radiologist impression.  I explained what an arterial venous malformation is and what the risks associated to it are.  To better evaluate this finding, including definition of high risk features, a catheter cerebral angiogram is required.  Management options were briefly discussed including endovascular embolization, open surgery and radiosurgery.  I explained that the cerebral angiogram will help define the best treatment course.  She is in agreement.  All questions were answered to her satisfaction.  Our team will reach out to Mrs. Spratlin to schedule a diagnostic cerebral angiogram.    Thank you for this interesting consult.  I greatly enjoyed meeting Renata Gambino and look forward to participating in their care.  A copy of this report was sent to the requesting provider on this date.  Electronically Signed: Baldemar Lenis, MD 03/19/2021, 2:53 PM   I spent a total of  30 Minutes in face to face in clinical consultation, greater than 50% of which was counseling/coordinating care for right occipital AVM.

## 2021-03-25 ENCOUNTER — Other Ambulatory Visit (HOSPITAL_COMMUNITY): Payer: Self-pay | Admitting: Neuroradiology

## 2021-03-25 ENCOUNTER — Telehealth (HOSPITAL_COMMUNITY): Payer: Self-pay

## 2021-03-25 DIAGNOSIS — Q2730 Arteriovenous malformation, site unspecified: Secondary | ICD-10-CM

## 2021-03-25 NOTE — Telephone Encounter (Signed)
Called to schedule diagnostic angio, no answer, left vm. AW

## 2021-04-04 ENCOUNTER — Other Ambulatory Visit: Payer: Self-pay | Admitting: Radiology

## 2021-04-05 ENCOUNTER — Inpatient Hospital Stay (HOSPITAL_COMMUNITY)
Admission: AD | Admit: 2021-04-05 | Discharge: 2021-04-06 | DRG: 064 | Disposition: A | Discharge status: Home / Self Care | Payer: BC Managed Care – PPO | Source: Ambulatory Visit | Attending: Internal Medicine | Admitting: Internal Medicine

## 2021-04-05 ENCOUNTER — Ambulatory Visit (HOSPITAL_COMMUNITY)
Admission: RE | Admit: 2021-04-05 | Discharge: 2021-04-05 | Disposition: A | Discharge status: Home / Self Care | Payer: BC Managed Care – PPO | Source: Ambulatory Visit | Attending: Neuroradiology | Admitting: Neuroradiology

## 2021-04-05 ENCOUNTER — Other Ambulatory Visit: Payer: Self-pay

## 2021-04-05 ENCOUNTER — Ambulatory Visit (HOSPITAL_COMMUNITY)
Admission: RE | Admit: 2021-04-05 | Discharge: 2021-04-05 | Disposition: A | Discharge status: Home / Self Care | Payer: BC Managed Care – PPO | Source: Ambulatory Visit | Attending: Student in an Organized Health Care Education/Training Program | Admitting: Student in an Organized Health Care Education/Training Program

## 2021-04-05 ENCOUNTER — Other Ambulatory Visit (HOSPITAL_COMMUNITY): Payer: Self-pay | Admitting: Neuroradiology

## 2021-04-05 DIAGNOSIS — I10 Essential (primary) hypertension: Secondary | ICD-10-CM | POA: Diagnosis present

## 2021-04-05 DIAGNOSIS — D519 Vitamin B12 deficiency anemia, unspecified: Secondary | ICD-10-CM | POA: Diagnosis present

## 2021-04-05 DIAGNOSIS — H538 Other visual disturbances: Secondary | ICD-10-CM | POA: Diagnosis present

## 2021-04-05 DIAGNOSIS — R29700 NIHSS score 0: Secondary | ICD-10-CM | POA: Diagnosis present

## 2021-04-05 DIAGNOSIS — I639 Cerebral infarction, unspecified: Secondary | ICD-10-CM | POA: Diagnosis not present

## 2021-04-05 DIAGNOSIS — E785 Hyperlipidemia, unspecified: Secondary | ICD-10-CM | POA: Diagnosis present

## 2021-04-05 DIAGNOSIS — Z6834 Body mass index (BMI) 34.0-34.9, adult: Secondary | ICD-10-CM | POA: Diagnosis not present

## 2021-04-05 DIAGNOSIS — I6349 Cerebral infarction due to embolism of other cerebral artery: Principal | ICD-10-CM | POA: Diagnosis present

## 2021-04-05 DIAGNOSIS — E669 Obesity, unspecified: Secondary | ICD-10-CM | POA: Diagnosis present

## 2021-04-05 DIAGNOSIS — F064 Anxiety disorder due to known physiological condition: Secondary | ICD-10-CM | POA: Diagnosis present

## 2021-04-05 DIAGNOSIS — Z20822 Contact with and (suspected) exposure to covid-19: Secondary | ICD-10-CM | POA: Diagnosis present

## 2021-04-05 DIAGNOSIS — E538 Deficiency of other specified B group vitamins: Secondary | ICD-10-CM

## 2021-04-05 DIAGNOSIS — Q282 Arteriovenous malformation of cerebral vessels: Secondary | ICD-10-CM | POA: Diagnosis not present

## 2021-04-05 DIAGNOSIS — Q2730 Arteriovenous malformation, site unspecified: Secondary | ICD-10-CM | POA: Diagnosis not present

## 2021-04-05 DIAGNOSIS — H5461 Unqualified visual loss, right eye, normal vision left eye: Secondary | ICD-10-CM | POA: Diagnosis present

## 2021-04-05 DIAGNOSIS — D500 Iron deficiency anemia secondary to blood loss (chronic): Secondary | ICD-10-CM | POA: Diagnosis present

## 2021-04-05 DIAGNOSIS — Z79899 Other long term (current) drug therapy: Secondary | ICD-10-CM | POA: Diagnosis not present

## 2021-04-05 DIAGNOSIS — I6340 Cerebral infarction due to embolism of unspecified cerebral artery: Secondary | ICD-10-CM | POA: Insufficient documentation

## 2021-04-05 HISTORY — PX: IR ANGIO INTRA EXTRACRAN SEL INTERNAL CAROTID BILAT MOD SED: IMG5363

## 2021-04-05 LAB — CBC
HCT: 36.6 % (ref 36.0–46.0)
HCT: 35.1 % — ABNORMAL LOW (ref 36.0–46.0)
Hemoglobin: 11.3 g/dL — ABNORMAL LOW (ref 12.0–15.0)
Hemoglobin: 11 g/dL — ABNORMAL LOW (ref 12.0–15.0)
MCH: 19.5 pg — ABNORMAL LOW (ref 26.0–34.0)
MCH: 19.5 pg — ABNORMAL LOW (ref 26.0–34.0)
MCHC: 30.9 g/dL (ref 30.0–36.0)
MCHC: 31.3 g/dL (ref 30.0–36.0)
MCV: 63.1 fL — ABNORMAL LOW (ref 80.0–100.0)
MCV: 62.2 fL — ABNORMAL LOW (ref 80.0–100.0)
Platelets: 302 10*3/uL (ref 150–400)
Platelets: 299 10*3/uL (ref 150–400)
RBC: 5.8 MIL/uL — ABNORMAL HIGH (ref 3.87–5.11)
RBC: 5.64 MIL/uL — ABNORMAL HIGH (ref 3.87–5.11)
RDW: 29 % — ABNORMAL HIGH (ref 11.5–15.5)
RDW: 28.7 % — ABNORMAL HIGH (ref 11.5–15.5)
WBC: 5.2 10*3/uL (ref 4.0–10.5)
WBC: 7.6 10*3/uL (ref 4.0–10.5)
nRBC: 0 % (ref 0.0–0.2)
nRBC: 0 % (ref 0.0–0.2)

## 2021-04-05 LAB — PROTIME-INR
INR: 1 (ref 0.8–1.2)
Prothrombin Time: 13 seconds (ref 11.4–15.2)

## 2021-04-05 LAB — BASIC METABOLIC PANEL
Anion gap: 5 (ref 5–15)
BUN: 10 mg/dL (ref 6–20)
CO2: 21 mmol/L — ABNORMAL LOW (ref 22–32)
Calcium: 8.9 mg/dL (ref 8.9–10.3)
Chloride: 111 mmol/L (ref 98–111)
Creatinine, Ser: 0.91 mg/dL (ref 0.44–1.00)
GFR, Estimated: >60 mL/min (ref >60)
Glucose, Bld: 89 mg/dL (ref 70–99)
Potassium: 3.8 mmol/L (ref 3.5–5.1)
Sodium: 137 mmol/L (ref 135–145)

## 2021-04-05 MED ORDER — MIDAZOLAM HCL 2 MG/2ML IJ SOLN
INTRAMUSCULAR | Status: AC
  Filled 2021-04-05: qty 2

## 2021-04-05 MED ORDER — VERAPAMIL HCL 2.5 MG/ML IV SOLN
INTRAVENOUS | Status: AC
  Filled 2021-04-05: qty 2

## 2021-04-05 MED ORDER — ACETAMINOPHEN 325 MG PO TABS
650.0000 mg | ORAL_TABLET | Freq: Four times a day (QID) | ORAL | Status: DC | PRN
  Administered 2021-04-05 – 2021-04-06 (×2): 650 mg via ORAL
  Filled 2021-04-05 (×2): qty 2

## 2021-04-05 MED ORDER — SODIUM CHLORIDE 0.9 % IV SOLN: Freq: Once | INTRAVENOUS | Status: AC

## 2021-04-05 MED ORDER — HYDROMORPHONE HCL 1 MG/ML IJ SOLN: 1.0000 mg | INTRAMUSCULAR | Status: DC | PRN

## 2021-04-05 MED ORDER — MIDAZOLAM HCL 2 MG/2ML IJ SOLN
INTRAMUSCULAR | Status: AC | PRN
  Administered 2021-04-05: 1 mg via INTRAVENOUS

## 2021-04-05 MED ORDER — STROKE: EARLY STAGES OF RECOVERY BOOK
Freq: Once | Status: AC
  Filled 2021-04-05: qty 1

## 2021-04-05 MED ORDER — ACETAMINOPHEN 500 MG PO TABS: 1000.0000 mg | ORAL_TABLET | Freq: Four times a day (QID) | ORAL | Status: DC | PRN

## 2021-04-05 MED ORDER — VITAMIN B-12 1000 MCG PO TABS
1000.0000 ug | ORAL_TABLET | Freq: Every day | ORAL | Status: DC
  Administered 2021-04-06: 1000 ug via ORAL
  Filled 2021-04-05: qty 1

## 2021-04-05 MED ORDER — ATORVASTATIN CALCIUM 80 MG PO TABS
80.0000 mg | ORAL_TABLET | Freq: Every day | ORAL | Status: DC
  Administered 2021-04-05 – 2021-04-06 (×2): 80 mg via ORAL
  Filled 2021-04-05 (×2): qty 1

## 2021-04-05 MED ORDER — ONDANSETRON HCL 4 MG/2ML IJ SOLN: 4.0000 mg | Freq: Four times a day (QID) | INTRAMUSCULAR | Status: DC | PRN

## 2021-04-05 MED ORDER — LIDOCAINE HCL 1 % IJ SOLN
INTRAMUSCULAR | Status: AC
  Administered 2021-04-05: 1 mL
  Filled 2021-04-05: qty 20

## 2021-04-05 MED ORDER — ASPIRIN 325 MG PO TABS
325.0000 mg | ORAL_TABLET | Freq: Every day | ORAL | Status: DC
  Administered 2021-04-05 – 2021-04-06 (×2): 325 mg via ORAL
  Filled 2021-04-05 (×2): qty 1

## 2021-04-05 MED ORDER — HYDROCODONE-ACETAMINOPHEN 5-325 MG PO TABS
1.0000 | ORAL_TABLET | ORAL | Status: DC | PRN
  Administered 2021-04-05: 2 via ORAL
  Filled 2021-04-05: qty 2

## 2021-04-05 MED ORDER — FENTANYL CITRATE (PF) 100 MCG/2ML IJ SOLN
INTRAMUSCULAR | Status: AC
  Filled 2021-04-05: qty 2

## 2021-04-05 MED ORDER — FENTANYL CITRATE (PF) 100 MCG/2ML IJ SOLN
INTRAMUSCULAR | Status: AC | PRN
  Administered 2021-04-05: 25 ug via INTRAVENOUS

## 2021-04-05 MED ORDER — IOHEXOL 240 MG/ML SOLN
INTRAMUSCULAR | Status: AC
  Filled 2021-04-05: qty 100

## 2021-04-05 MED ORDER — FERROUS SULFATE 325 (65 FE) MG PO TABS
325.0000 mg | ORAL_TABLET | Freq: Every day | ORAL | Status: DC
  Administered 2021-04-06: 325 mg via ORAL
  Filled 2021-04-05: qty 1

## 2021-04-05 MED ORDER — PROCHLORPERAZINE EDISYLATE 10 MG/2ML IJ SOLN
10.0000 mg | Freq: Four times a day (QID) | INTRAMUSCULAR | Status: DC | PRN
  Administered 2021-04-05: 10 mg via INTRAVENOUS
  Filled 2021-04-05: qty 2

## 2021-04-05 MED ORDER — HEPARIN SODIUM (PORCINE) 1000 UNIT/ML IJ SOLN
INTRAMUSCULAR | Status: AC
  Administered 2021-04-05: 5000 [IU]
  Filled 2021-04-05: qty 1

## 2021-04-05 MED ORDER — IOHEXOL 240 MG/ML SOLN
150.0000 mL | Freq: Once | INTRAMUSCULAR | Status: AC | PRN
  Administered 2021-04-05: 65 mL via INTRA_ARTERIAL

## 2021-04-05 MED ORDER — ACETAMINOPHEN 500 MG PO TABS
ORAL_TABLET | ORAL | Status: AC
  Administered 2021-04-05: 1000 mg via ORAL
  Filled 2021-04-05: qty 2

## 2021-04-05 MED ORDER — KETOROLAC TROMETHAMINE 15 MG/ML IJ SOLN
30.0000 mg | Freq: Once | INTRAMUSCULAR | Status: AC
  Administered 2021-04-05: 30 mg via INTRAVENOUS
  Filled 2021-04-05: qty 2

## 2021-04-05 MED ORDER — NITROGLYCERIN 1 MG/10 ML FOR IR/CATH LAB
INTRA_ARTERIAL | Status: AC
  Administered 2021-04-05: 4 mL
  Filled 2021-04-05: qty 10

## 2021-04-05 NOTE — Significant Event (Signed)
Rapid Response Event Note   Reason for Call :  Code Stroke  Initial Focused Assessment:  Patient complaining of a head ache and blurred vision. She is also very anxious and tearful. She states she doesn't remember everything from the afternoon.  NIHSS 1 for missed question.  (she did receive versed and fentanyl during the procedure and vicodin about 1605)  BP 137/70  HR 85  RR 22  O2 sat 100% on RA  Interventions:  Stat Head CT Stat MRI  Admit to inpatient for stroke work up   Plan of Care:  Modified NIHSS NPO until swallow screen   Event Summary:   MD Notified: Dr Quay Burow and Dr Wilford Corner at bedside Call Time: 1721 Arrival Time: 1725 End Time: 1900  Marcellina Millin, RN

## 2021-04-05 NOTE — Progress Notes (Signed)
Patient continued to have syncopal episodes and blurred vision. CT scan completed. MD is admitting patient to [facility].

## 2021-04-05 NOTE — Procedures (Signed)
INTERVENTIONAL NEURORADIOLOGY BRIEF POSTPROCEDURE NOTE  DIAGNOSTIC CEREBRAL ANGIOGRAM  Attending: Dr. Jerilynn Mages de Melchor Amour  Assistant: None.  Diagnosis: Right occipital AVM.  Access site: Distal right radial artery.  Access closure: Inflatable band.  Anesthesia: Moderate sedation.  Medication used: 1 mg Versed IV; 25 mcg Fentanyl IV.  Complications: None.  Estimated blood loss: Negligible.  Specimen: None.  Findings: Right occipital AVM spetzler martin grade 2 (1 + 1 + 0) supplied by right PCA branches and draining in a single dilated vein.   The patient tolerated the procedure well without incident or complication and is in stable condition.

## 2021-04-05 NOTE — Consult Note (Addendum)
Neurology Consultation  Reason for Consult: Visual disturbance Referring Physician: Gaye Alken, interventional radiology  CC: Visual disturbance, headache  History is obtained from: Patient, chart  HPI: Jodi Wood is a 40 y.o. female who has a past medical history of anemia, who had a diagnostic cerebral angiogram for a right occipital AVM on imaging which was discovered when she was admitted to [facility] long hospital for 03/14/2021 to 03/15/2021 for headaches and loss of vision, right after the procedure today reported visual disturbance and blurred vision on the right side.  Since she just had an angiographic procedure done, high suspicion for stroke and a code stroke was called.  Her last known well was somewhere before 1:00 PM. She was quickly taken to the CT scanner from short stay by Dr. Salvadore Dom and short stay team.  Noncontrast head CT was unremarkable for acute process.  No bleed. Low NIH stroke scale precluded tPA administration. Given high suspicion for stroke, taken for stat MRI to ensure no subtle bleed or ischemic strokes. Brain MRI was completed and bilateral punctate acute ischemic strokes were noted-likely periprocedural. She was admitted for further stroke work-up and management.  Patient has a history of headaches which are migrainous in nature.  She gets visual aura with bright lights and stars kind of feeling but not blurring of her vision.  The symptoms are very different from her usual headache.  She is also extremely anxious and expressed her anxiety due to being in the hospital and going through a procedure.  Denies any chest pain nausea vomiting at this time.   LKW: 1 PM on 04/05/2021 tpa given?: no, low NIH, and by the time CT was done she was outside the window. Premorbid modified Rankin scale (mRS): 0  ROS: Full ROS was performed and is negative except as noted in the HPI.  No past medical history on file.   Family History  Adopted:  Yes     Social History:   reports that she has never smoked. She has never used smokeless tobacco. She reports previous alcohol use. She reports that she does not use drugs.  Medications  Current Outpatient Medications:  .  acetaminophen (TYLENOL) 500 MG tablet, Take 500-1,000 mg by mouth every 6 (six) hours as needed for moderate pain or headache., Disp: , Rfl:  .  Pediatric Multivitamins-Iron (FLINTSTONES PLUS IRON PO), Take 1 tablet by mouth daily., Disp: , Rfl:  .  ferrous sulfate 325 (65 FE) MG EC tablet, Take 1 tablet (325 mg total) by mouth daily with breakfast. (Patient not taking: No sig reported), Disp: 30 tablet, Rfl: 0 .  ibuprofen (ADVIL) 200 MG tablet, Take 2 tablets (400 mg total) by mouth every 8 (eight) hours as needed for mild pain. (Patient not taking: Reported on 04/01/2021), Disp: 30 tablet, Rfl:  .  vitamin B-12 1000 MCG tablet, Take 1 tablet (1,000 mcg total) by mouth daily. (Patient not taking: Reported on 04/01/2021), Disp: 30 tablet, Rfl: 0  Current Facility-Administered Medications:  .  acetaminophen (TYLENOL) tablet 1,000 mg, 1,000 mg, Oral, Q6H PRN, de Melchor Amour, Jerilynn Mages, MD, 1,000 mg at 04/05/21 1441 .  fentaNYL (SUBLIMAZE) 100 MCG/2ML injection, , , ,  .  HYDROcodone-acetaminophen (NORCO/VICODIN) 5-325 MG per tablet 1-2 tablet, 1-2 tablet, Oral, Q4H PRN, de Melchor Amour, Jerilynn Mages, MD, 2 tablet at 04/05/21 1605 .  iohexol (OMNIPAQUE) 240 MG/ML injection, , , ,  .  midazolam (VERSED) 2 MG/2ML injection, , , ,  .  ondansetron (ZOFRAN)  injection 4 mg, 4 mg, Intravenous, Q6H PRN, de Melchor Amour, [place], MD .  verapamil (ISOPTIN) 2.5 MG/ML injection, , , ,    Exam: Current vital signs: BP (!) 102/58   Pulse 71   Temp 98.5 F (36.9 C) (Oral)   Resp 16   Ht 5' (1.524 m)   Wt 79.4 kg   LMP 03/15/2021   SpO2 100%   BMI 34.18 kg/m  Vital signs in last 24 hours: Temp:  [98.5 F (36.9 C)] 98.5 F (36.9 C) (05/20 1010) Pulse Rate:  [71-98]  71 (05/20 1550) Resp:  [15-20] 16 (05/20 1454) BP: (102-141)/(58-86) 102/58 (05/20 1530) SpO2:  [100 %] 100 % (05/20 1550) Weight:  [79.4 kg] 79.4 kg (05/20 1010) GENERAL: Awake, alert in NAD but somewhat anxious appearing HEENT: - Normocephalic and atraumatic, dry mm, no LN++, no Thyromegally LUNGS - Clear to auscultation bilaterally with no wheezes CV - S1S2 RRR, no m/r/g, equal pulses bilaterally. ABDOMEN - Soft, nontender, nondistended with normoactive BS Ext: warm, well perfused, intact peripheral pulses, no edema.  Right arm in bandage from the radial side closure.  NEURO:  Mental Status: AA&Ox3  Language: speech is clear and not dysarthric.  Naming, repetition, fluency, and comprehension intact. Cranial Nerves: PERRL.  Visual acuity is blurry with the right eye but fine with the left eye.  EOMI, visual fields full, no facial asymmetry, facial sensation intact, hearing intact, tongue/uvula/soft palate midline, normal sternocleidomastoid and trapezius muscle strength. No evidence of tongue atrophy or fibrillations Motor: 5/5 without drift in all fours Tone: is normal and bulk is norma Sensation- Intact to light touch bilaterally Coordination: FTN intact bilaterally, no ataxia in BLE. Gait- deferred  NIHSS-0   Labs I have reviewed labs in epic and the results pertinent to this consultation are:   CBC    Component Value Date/Time   WBC 5.2 04/05/2021 1029   RBC 5.80 (H) 04/05/2021 1029   HGB 11.3 (L) 04/05/2021 1029   HCT 36.6 04/05/2021 1029   PLT 302 04/05/2021 1029   MCV 63.1 (L) 04/05/2021 1029   MCH 19.5 (L) 04/05/2021 1029   MCHC 30.9 04/05/2021 1029   RDW 29.0 (H) 04/05/2021 1029   LYMPHSABS 3.1 03/14/2021 2045   MONOABS 0.6 03/14/2021 2045   EOSABS 0.2 03/14/2021 2045   BASOSABS 0.0 03/14/2021 2045    CMP     Component Value Date/Time   NA 137 04/05/2021 1029   K 3.8 04/05/2021 1029   CL 111 04/05/2021 1029   CO2 21 (L) 04/05/2021 1029   GLUCOSE 89  04/05/2021 1029   BUN 10 04/05/2021 1029   CREATININE 0.91 04/05/2021 1029   CALCIUM 8.9 04/05/2021 1029   PROT 7.8 03/14/2021 2045   ALBUMIN 4.5 03/14/2021 2045   AST 19 03/14/2021 2045   ALT 16 03/14/2021 2045   ALKPHOS 59 03/14/2021 2045   BILITOT 0.3 03/14/2021 2045   GFRNONAA >60 04/05/2021 1029    Lipid Panel     Component Value Date/Time   CHOL 142 03/15/2021 0837   TRIG 40 03/15/2021 0837   HDL 51 03/15/2021 0837   CHOLHDL 2.8 03/15/2021 0837   VLDL 8 03/15/2021 0837   LDLCALC 83 03/15/2021 0837     Imaging I have reviewed the images obtained:  CT-scan of the brain-no acute changes  MRI examination of the brain-bilateral punctate embolic-looking infarcts, in the right insula, right frontal white matter, right superior frontal cortex and left superior frontal cortex. SWI of images reveal  stable nidus of abnormal vessel with surrounding abscess in the right occipital lobe-likely the AVM.  Assessment: 41 year old woman with a history of anemia and a recent diagnosis of a right occipital AVM who underwent diagnostic cerebral angiogram today, started complaining of some visual disturbance mostly with the right eye after the procedure along with a headache. Stat CT head unremarkable MRI brain with multifocal punctate ischemia-likely periprocedural Low NIH precluded tPA administration although she was barely within the window for IV tPA-last known well was somewhere around 1 PM and I was approached about her and a code stroke was activated somewhere around 5:25 PM-and she was past the window for IV tPA by the time CT head was obtained. 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  Frequent  neurochecks  Telemetry  2D echo was done on 03/15/2021-no need to repeat  Had a cerebral angiogram done today-noted to repeat vessel imaging  Start aspirin 325  Atorvastatin 80  PT  OT  Speech therapy  N.p.o. until cleared by bedside swallow screen  Allow for permissive hypertension-treat only systolic blood pressures greater than 220 on a as needed basis   Stroke team will follow  Case discussed with Dr. Ouida Sills, neuro interventional radiology and Dr. Cyndia Bent, hospitalist  -- Milon Dikes, MD Neurologist [facility] Pager: [phone]

## 2021-04-05 NOTE — Progress Notes (Signed)
INTERVENTIONAL NEURORADIOLOGY PROGRESS NOTE  Patient complained of progressive blurred vision and headache post angiogram. She has a history of migraines. Headache worsened despite pain medication with persistent blurry vision. Code stroke was activated and patient was taken to C. No acute findings seen. Brain MRI was then performed showing multiple punctate infarcts in the bilateral anterior circulation, likely periprocedural. Patient evaluated by Dr. Wilford Corner who recommended admission for further work-up and observation. I spoke to patient who was very anxious around 6:40 pm. She appeared to understand the situation and appeared more calm after I explained findings of CT and MRI. She was then transferred to 3W[facility]26.  Husband updated by phone.  K.de Melchor Amour Interventional Neuroradiology

## 2021-04-05 NOTE — Progress Notes (Signed)
Patient was complaining blurred vision after procedure. Radiologist aware and stated to update if continues blurred vision. Patient c/o headache in middle of forehead 8/10 pain. Writer administered PRN pain medicine. After 30 minutes, patient c/o nausea and continued blurred vision. Patient felt lightheaded when going to bathroom with assistance. Writer to administer PRN Zophran for c/o nausea. MD notified and placed orders for STAT CT scan. Will continue to monitor.
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
## 2021-04-05 NOTE — H&P (Signed)
Chief Complaint: Patient was seen in consultation today for right occipital AVM/diagnostic cerebral arteriogram.  Referring Physician(s): Jefferson Fuel (neurology)  Supervising Physician: Baldemar Lenis  Patient Status: [facility], [place] - Out-pt  History of Present Illness: Jodi Wood is a 40 y.o. female with a past medical history of anemia. She was admitted to [facility] 03/14/2021 to 03/15/2021 for management of loss of vision and right-sided headache. 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 She was seen by neurology that admission who recommended OP NIR follow-up for right occipital AVM. She was seen by Dr. Tommie Sams in clinic 03/19/2021 to discuss management options. At that time, through shared decision making, patient elected to pursue with a diagnostic cerebral arteriogram to fully evaluate AVM.  Patient presents today for possible image-guided diagnostic cerebral arteriogram. Patient awake and alert laying in bed with no complaints at this time. Denies fever, chills, chest pain, dyspnea, abdominal pain, or headache.   No past medical history on file.  Past Surgical History:  Procedure Laterality Date  . CESAREAN SECTION      Allergies: Patient has no known allergies.  Medications: Prior to Admission medications   Medication Sig Start Date End Date Taking? Authorizing Provider  acetaminophen (TYLENOL) 500 MG tablet Take 500-1,000 mg by mouth every 6 (six) hours as needed for moderate pain or headache.   Yes [provider]  Pediatric Multivitamins-Iron (FLINTSTONES PLUS IRON PO) Take 1 tablet by mouth daily.   Yes [provider]  ferrous sulfate 325 (65 FE) MG EC tablet Take 1 tablet (325 mg total) by mouth daily with breakfast. Patient not taking: No sig reported 03/15/21 04/14/21  Briant Cedar, MD  ibuprofen (ADVIL) 200 MG tablet Take 2 tablets (400 mg total) by mouth every 8 (eight) hours as needed  for mild pain. Patient not taking: Reported on 04/01/2021 03/15/21   Briant Cedar, MD  vitamin B-12 1000 MCG tablet Take 1 tablet (1,000 mcg total) by mouth daily. Patient not taking: Reported on 04/01/2021 03/16/21 04/15/21  Briant Cedar, MD  pantoprazole (PROTONIX) 20 MG tablet Take 1 tablet (20 mg total) by mouth daily. Patient not taking: Reported on 02/21/2020 11/29/19 02/21/20  Hall-Potvin, [place], PA-C     Family History  Adopted: Yes    Social History   Socioeconomic History  . Marital status: Married    Spouse name: Not on file  . Number of children: Not on file  . Years of education: Not on file  . Highest education level: Not on file  Occupational History  . Not on file  Tobacco Use  . Smoking status: Never Smoker  . Smokeless tobacco: Never Used  Substance and Sexual Activity  . Alcohol use: Not Currently    Comment: 3 x a year  . Drug use: Never  . Sexual activity: Not on file  Other Topics Concern  . Not on file  Social History Narrative  . Not on file   Social Determinants of Health   Financial Resource Strain: Not on file  Food Insecurity: Not on file  Transportation Needs: Not on file  Physical Activity: Not on file  Stress: Not on file  Social Connections: Not on file     Review of Systems: A 12 point ROS discussed and pertinent positives are indicated in the HPI above.  All other systems are negative.  Review of Systems  Constitutional: Negative for chills and fever.  Respiratory: Negative for  shortness of breath and wheezing.   Cardiovascular: Negative for chest pain and palpitations.  Gastrointestinal: Negative for abdominal pain.  Neurological: Negative for headaches.  Psychiatric/Behavioral: Negative for behavioral problems and confusion.    Vital Signs: LMP 03/15/2021   Physical Exam Vitals and nursing note reviewed.  Constitutional:      General: She is not in acute distress. Cardiovascular:     Rate and Rhythm: Normal  rate and regular rhythm.     Heart sounds: Normal heart sounds. No murmur heard.   Pulmonary:     Effort: Pulmonary effort is normal. No respiratory distress.     Breath sounds: Normal breath sounds. No wheezing.  Skin:    General: Skin is warm and dry.  Neurological:     Mental Status: She is alert and oriented to person, place, and time.          Imaging: CT ANGIO HEAD W OR WO CONTRAST  Result Date: 03/15/2021 CLINICAL DATA:  Transient ischemic attack (TIA). Additional history provided: Transient left-sided vision loss small driving. EXAM: CT ANGIOGRAPHY HEAD AND NECK TECHNIQUE: Multidetector CT imaging of the head and neck was performed using the standard protocol during bolus administration of intravenous contrast. Multiplanar CT image reconstructions and MIPs were obtained to evaluate the vascular anatomy. Carotid stenosis measurements (when applicable) are obtained utilizing NASCET criteria, using the distal internal carotid diameter as the denominator. CONTRAST:  6mL OMNIPAQUE IOHEXOL 350 MG/ML SOLN COMPARISON:  Brain MRI performed earlier today 03/15/2021. Noncontrast head CT performed earlier today 03/14/2021. FINDINGS: CTA NECK FINDINGS Aortic arch: Common origin of the innominate and left common carotid arteries. The visualized aortic arch is normal in caliber. No hemodynamically significant innominate or proximal subclavian artery stenosis. Right carotid system: CCA and ICA patent within the neck without stenosis. No significant atherosclerotic disease. Left carotid system: CCA and ICA patent within the neck without stenosis. No significant atherosclerotic disease. Vertebral arteries: Vertebral arteries patent within the neck without stenosis. Left vertebral artery dominant. Skeleton: No acute bony abnormality or aggressive osseous lesion. Other neck: No cervical lymphadenopathy. Multiple thyroid nodules, the largest within the right lobe measuring 1.5 cm. Upper chest: No  consolidation within the imaged lung apices. Review of the MIP images confirms the above findings CTA HEAD FINDINGS Anterior circulation: The intracranial internal carotid arteries are patent. The M1 middle cerebral arteries are patent. No M2 proximal branch occlusion or high-grade proximal stenosis is identified. The anterior cerebral arteries are patent. No intracranial aneurysm is identified. Posterior circulation: The intracranial vertebral arteries are patent. The basilar artery is patent. The posterior cerebral arteries are patent. Posterior communicating arteries are hypoplastic or absent bilaterally. There is a nidus of abnormal arterial enhancing vessels within the right occipital lobe compatible with an arteriovenous malformation (AVM) (for instance as seen on series 7, image 239) (series 10, image 49). Given its location, this AVM is likely at least partially fed by the right posterior cerebral artery. Venous sinuses: Within the limitations of contrast timing, no convincing thrombus. Anatomic variants: As described. Review of the MIP images confirms the above findings IMPRESSION: CTA neck: 1. The common carotid, internal carotid and vertebral arteries are patent within the neck without stenosis or significant atherosclerotic disease. 2. Multiple thyroid nodules, the largest measuring 1.5 cm within the right lobe. Nonemergent thyroid ultrasound recommended for further evaluation. CTA head: 1. Nidus of abnormal arterially enhancing vessels within the right occipital lobe compatible with arteriovenous malformation (AVM). Given its location, this AVM is likely at least  partially fed by the right posterior cerebral artery. Neurointerventional consultation is recommended, and catheter-based angiography should be considered for further evaluation. 2. No intracranial large vessel occlusion or proximal high-grade arterial stenosis. Electronically Signed   By: Jackey Loge DO   On: 03/15/2021 17:21   CT ANGIO  NECK W OR WO CONTRAST  Result Date: 03/15/2021 CLINICAL DATA:  Transient ischemic attack (TIA). Additional history provided: Transient left-sided vision loss small driving. EXAM: CT ANGIOGRAPHY HEAD AND NECK TECHNIQUE: Multidetector CT imaging of the head and neck was performed using the standard protocol during bolus administration of intravenous contrast. Multiplanar CT image reconstructions and MIPs were obtained to evaluate the vascular anatomy. Carotid stenosis measurements (when applicable) are obtained utilizing NASCET criteria, using the distal internal carotid diameter as the denominator. CONTRAST:  75mL OMNIPAQUE IOHEXOL 350 MG/ML SOLN COMPARISON:  Brain MRI performed earlier today 03/15/2021. Noncontrast head CT performed earlier today 03/14/2021. FINDINGS: CTA NECK FINDINGS Aortic arch: Common origin of the innominate and left common carotid arteries. The visualized aortic arch is normal in caliber. No hemodynamically significant innominate or proximal subclavian artery stenosis. Right carotid system: CCA and ICA patent within the neck without stenosis. No significant atherosclerotic disease. Left carotid system: CCA and ICA patent within the neck without stenosis. No significant atherosclerotic disease. Vertebral arteries: Vertebral arteries patent within the neck without stenosis. Left vertebral artery dominant. Skeleton: No acute bony abnormality or aggressive osseous lesion. Other neck: No cervical lymphadenopathy. Multiple thyroid nodules, the largest within the right lobe measuring 1.5 cm. Upper chest: No consolidation within the imaged lung apices. Review of the MIP images confirms the above findings CTA HEAD FINDINGS Anterior circulation: The intracranial internal carotid arteries are patent. The M1 middle cerebral arteries are patent. No M2 proximal branch occlusion or high-grade proximal stenosis is identified. The anterior cerebral arteries are patent. No intracranial aneurysm is identified.  Posterior circulation: The intracranial vertebral arteries are patent. The basilar artery is patent. The posterior cerebral arteries are patent. Posterior communicating arteries are hypoplastic or absent bilaterally. There is a nidus of abnormal arterial enhancing vessels within the right occipital lobe compatible with an arteriovenous malformation (AVM) (for instance as seen on series 7, image 239) (series 10, image 49). Given its location, this AVM is likely at least partially fed by the right posterior cerebral artery. Venous sinuses: Within the limitations of contrast timing, no convincing thrombus. Anatomic variants: As described. Review of the MIP images confirms the above findings IMPRESSION: CTA neck: 1. The common carotid, internal carotid and vertebral arteries are patent within the neck without stenosis or significant atherosclerotic disease. 2. Multiple thyroid nodules, the largest measuring 1.5 cm within the right lobe. Nonemergent thyroid ultrasound recommended for further evaluation. CTA head: 1. Nidus of abnormal arterially enhancing vessels within the right occipital lobe compatible with arteriovenous malformation (AVM). Given its location, this AVM is likely at least partially fed by the right posterior cerebral artery. Neurointerventional consultation is recommended, and catheter-based angiography should be considered for further evaluation. 2. No intracranial large vessel occlusion or proximal high-grade arterial stenosis. Electronically Signed   By: Jackey Loge DO   On: 03/15/2021 17:21   MR BRAIN WO CONTRAST  Addendum Date: 03/15/2021   ADDENDUM REPORT: 03/15/2021 15:05 ADDENDUM: Limitations, findings and recommendations called by telephone at the time of interpretation on 03/15/2021 at 3:04 pm to provider Dr Cristal Generous, who verbally acknowledged these results. Electronically Signed   By: Jackey Loge DO   On: 03/15/2021 15:05  Result Date: 03/15/2021 CLINICAL DATA:  Neuro  deficit, acute, stroke suspected. Additional history provided: Episode of left sided vision loss last night while driving, vision now back to normal, left-sided weakness. EXAM: MRI HEAD WITHOUT CONTRAST TECHNIQUE: Multiplanar, multiecho pulse sequences of the brain and surrounding structures were obtained without intravenous contrast. COMPARISON:  Noncontrast head CT 03/14/2021. FINDINGS: Due to oral iron supplementation prior to imaging, significant artifact affects multiple sequences. The diffusion-weighted and SWI sequences are most notably affected. Brain: Cerebral volume is normal. There is an apparent nidus of abnormal vessels within the right occipital lobe with subtle surrounding gliosis (for instance as seen on series 9, image 25). Chiari I malformation. The cerebellar tonsils extend 15 mm below the foramen magnum. Associated crowding at the level of the foramen magnum. Partially empty sella turcica. There is no evidence of acute infarct. However, artifact limits the reliability of the diffusion-weighted sequences, and an acute infarct cannot be excluded on the basis of this exam. Artifact also precludes adequate evaluation for chronic intracranial blood products. No evidence of an intracranial mass. No extra-axial fluid collection. No midline shift. Vascular: Expected proximal arterial flow voids. Skull and upper cervical spine: No focal marrow lesion. Sinuses/Orbits: Visualized orbits show no acute finding. Trace left ethmoid sinus mucosal thickening. IMPRESSION: Due to oral iron supplementation prior to imaging, significant artifact affects multiple sequences. The diffusion-weighted and SWI sequences are most notably affected. There is no evidence of acute infarct. However, artifact limits the reliability of the diffusion-weighted imaging, and an acute infarct cannot be excluded on the basis of this exam. Apparent nidus of abnormal vessels with surrounding gliosis in the right occipital lobe. Findings  likely reflect an arteriovenous malformation, and a CTA of the head/neck is recommended for further evaluation. Redemonstrated Chiari I malformation. Electronically Signed: By: Jackey Loge DO On: 03/15/2021 14:45   DG CHEST PORT 1 VIEW  Result Date: 03/15/2021 CLINICAL DATA:  Transient ischemic attack EXAM: PORTABLE CHEST 1 VIEW COMPARISON:  None. FINDINGS: The heart size and mediastinal contours are within normal limits. Both lungs are clear. The visualized skeletal structures are unremarkable. IMPRESSION: No active disease. Electronically Signed   By: Helyn Numbers MD   On: 03/15/2021 00:44   ECHOCARDIOGRAM COMPLETE  Result Date: 03/15/2021    ECHOCARDIOGRAM REPORT   Patient Name:   Jodi Wood Date of Exam: 03/15/2021 Medical Rec #:  [id number]            Height:       60.0 in Accession #:    [id number]           Weight:       175.0 lb Date of Birth:  Oct 15, 1981             BSA:          1.764 m Patient Age:    39 years             BP:           128/79 mmHg Patient Gender: F                    HR:           73 bpm. Exam Location:  Inpatient Procedure: 2D Echo Indications:    TIA  History:        Patient has no prior history of Echocardiogram examinations.  Sonographer:    Thurman Coyer RDCS (AE) Referring Phys: [id number] ANASTASSIA DOUTOVA IMPRESSIONS  1. Left ventricular  ejection fraction, by estimation, is 60 to 65%. The left ventricle has normal function. The left ventricle has no regional wall motion abnormalities. Left ventricular diastolic parameters were normal.  2. Right ventricular systolic function is normal. The right ventricular size is normal. There is normal pulmonary artery systolic pressure.  3. The mitral valve is normal in structure. No evidence of mitral valve regurgitation. No evidence of mitral stenosis.  4. The aortic valve is tricuspid. Aortic valve regurgitation is not visualized. No aortic stenosis is present.  5. The inferior vena cava is normal in size with greater than  50% respiratory variability, suggesting right atrial pressure of 3 mmHg. FINDINGS  Left Ventricle: Left ventricular ejection fraction, by estimation, is 60 to 65%. The left ventricle has normal function. The left ventricle has no regional wall motion abnormalities. The left ventricular internal cavity size was normal in size. There is  no left ventricular hypertrophy. Left ventricular diastolic parameters were normal. Indeterminate filling pressures. Right Ventricle: The right ventricular size is normal. No increase in right ventricular wall thickness. Right ventricular systolic function is normal. There is normal pulmonary artery systolic pressure. The tricuspid regurgitant velocity is 1.56 m/s, and  with an assumed right atrial pressure of 3 mmHg, the estimated right ventricular systolic pressure is 12.7 mmHg. Left Atrium: Left atrial size was normal in size. Right Atrium: Right atrial size was normal in size. Pericardium: There is no evidence of pericardial effusion. Mitral Valve: The mitral valve is normal in structure. No evidence of mitral valve regurgitation. No evidence of mitral valve stenosis. Tricuspid Valve: The tricuspid valve is normal in structure. Tricuspid valve regurgitation is trivial. No evidence of tricuspid stenosis. Aortic Valve: The aortic valve is tricuspid. Aortic valve regurgitation is not visualized. No aortic stenosis is present. Pulmonic Valve: The pulmonic valve was normal in structure. Pulmonic valve regurgitation is not visualized. No evidence of pulmonic stenosis. Aorta: The aortic root is normal in size and structure. Venous: The inferior vena cava is normal in size with greater than 50% respiratory variability, suggesting right atrial pressure of 3 mmHg. IAS/Shunts: No atrial level shunt detected by color flow Doppler.  LEFT VENTRICLE PLAX 2D LVIDd:         4.20 cm  Diastology LVIDs:         2.50 cm  LV e' medial:    7.40 cm/s LV PW:         0.90 cm  LV E/e' medial:  10.4 LV IVS:         0.90 cm  LV e' lateral:   12.90 cm/s LVOT diam:     2.10 cm  LV E/e' lateral: 5.9 LV SV:         62 LV SV Index:   35 LVOT Area:     3.46 cm  RIGHT VENTRICLE RV S prime:     15.60 cm/s TAPSE (M-mode): 1.3 cm LEFT ATRIUM             Index       RIGHT ATRIUM           Index LA diam:        2.70 cm 1.53 cm/m  RA Area:     14.70 cm LA Vol (A2C):   34.6 ml 19.62 ml/m RA Volume:   36.00 ml  20.41 ml/m LA Vol (A4C):   27.5 ml 15.59 ml/m LA Biplane Vol: 31.4 ml 17.80 ml/m  AORTIC VALVE LVOT Vmax:   96.60 cm/s LVOT Vmean:  59.800 cm/s LVOT VTI:    0.180 m  AORTA Ao Root diam: 3.20 cm MITRAL VALVE               TRICUSPID VALVE MV Area (PHT): 3.03 cm    TR Peak grad:   9.7 mmHg MV Decel Time: 250 msec    TR Vmax:        156.00 cm/s MV E velocity: 76.70 cm/s MV A velocity: 64.30 cm/s  SHUNTS MV E/A ratio:  1.19        Systemic VTI:  0.18 m                            Systemic Diam: 2.10 cm Chilton Siiffany Colony MD Electronically signed by Chilton Siiffany Charlotte Park MD Signature Date/Time: 03/15/2021/5:12:50 PM    Final    CT HEAD CODE STROKE WO CONTRAST  Result Date: 03/14/2021 CLINICAL DATA:  Code stroke. Initial evaluation for acute left-sided visual loss. EXAM: CT HEAD WITHOUT CONTRAST TECHNIQUE: Contiguous axial images were obtained from the base of the skull through the vertex without intravenous contrast. COMPARISON:  None. FINDINGS: Brain: Cerebral volume within normal limits for patient age. No evidence for acute intracranial hemorrhage. No findings to suggest acute large vessel territory infarct. No mass lesion, midline shift, or mass effect. Ventricles are normal in size without evidence for hydrocephalus. No extra-axial fluid collection identified. 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 Vascular: No hyperdense vessel identified. Skull: Scalp soft tissues demonstrate no acute abnormality. Calvarium intact. Sinuses/Orbits: Globes and orbital soft tissues within  normal limits. Visualized paranasal sinuses are clear. No mastoid effusion. ASPECTS [facility]([place] Stroke Program Early CT Score) - Ganglionic level infarction (caudate, lentiform nuclei, internal capsule, insula, M1-M3 cortex): 7 - Supraganglionic infarction (M4-M6 cortex): 3 Total score (0-10 with 10 being normal): 10 IMPRESSION: 1. No acute intracranial abnormality. 2. ASPECTS is 10. 3. Chiari 1 malformation. Critical Value/emergent results were called by telephone at the time of interpretation on 03/14/2021 at 9:11 pm to provider [facility]ENT[facility] , who verbally acknowledged these results. Electronically Signed   By: Rise MuBenjamin  McClintock M.D.   On: 03/14/2021 21:11   VAS US CAROTID  Result Date: 03/15/2021 Carotid Arterial Duplex Study Patient Name:  Blanca FriendRICA MAELINA Perkey  Date of Exam:   03/15/2021 Medical Rec #: [id number]             Accession #:    [id number] Date of Birth: 04/10/1981              Patient Gender: F Patient Age:   [age]Y [facility] Procedure:      VAS US CAROTID Referring Phys: [id number] ANASTASSIA DOUTOVA --------------------------------------------------------------------------------  Indications:       TIA. Comparison Study:  no prior Performing Technologist: Blanch MediaMegan Riddle RVS  Examination Guidelines: A complete evaluation includes B-mode imaging, spectral Doppler, color Doppler, and power Doppler as needed of all accessible portions of each vessel. Bilateral testing is considered an integral part of a complete examination. Limited examinations for reoccurring indications may be performed as noted.  Right Carotid Findings: +----------+--------+--------+--------+------------------+--------+           PSV cm/sEDV cm/sStenosisPlaque DescriptionComments +----------+--------+--------+--------+------------------+--------+ CCA Prox  116     25              heterogenous               +----------+--------+--------+--------+------------------+--------+ CCA Distal97  28               heterogenous               +----------+--------+--------+--------+------------------+--------+ ICA Prox  94      34      1-39%   heterogenous               +----------+--------+--------+--------+------------------+--------+ ICA Distal123     42                                         +----------+--------+--------+--------+------------------+--------+ ECA       88      12                                         +----------+--------+--------+--------+------------------+--------+ +----------+--------+-------+--------+-------------------+           PSV cm/sEDV cmsDescribeArm Pressure (mmHG) +----------+--------+-------+--------+-------------------+ [id number]                                         +----------+--------+-------+--------+-------------------+ +---------+--------+--+--------+--+---------+ VertebralPSV cm/s55EDV cm/s10Antegrade +---------+--------+--+--------+--+---------+  Left Carotid Findings: +----------+--------+--------+--------+------------------+--------+           PSV cm/sEDV cm/sStenosisPlaque DescriptionComments +----------+--------+--------+--------+------------------+--------+ CCA Prox  133     28              heterogenous               +----------+--------+--------+--------+------------------+--------+ CCA Distal83      21              heterogenous               +----------+--------+--------+--------+------------------+--------+ ICA Prox  81      33      1-39%   heterogenous               +----------+--------+--------+--------+------------------+--------+ ICA Distal139     53                                         +----------+--------+--------+--------+------------------+--------+ ECA       75      16                                         +----------+--------+--------+--------+------------------+--------+ +----------+--------+--------+--------+-------------------+           PSV cm/sEDV  cm/sDescribeArm Pressure (mmHG) +----------+--------+--------+--------+-------------------+ [id number]                                          +----------+--------+--------+--------+-------------------+ +---------+--------+--+--------+--+---------+ VertebralPSV cm/s49EDV cm/s23Antegrade +---------+--------+--+--------+--+---------+   Summary: Right Carotid: Velocities in the right ICA are consistent with a 1-39% stenosis. Left Carotid: Velocities in the left ICA are consistent with a 1-39% stenosis. Vertebrals: Bilateral vertebral arteries demonstrate antegrade flow. *See table(s) above for measurements and observations.  Electronically signed by Delia Heady MD on 03/15/2021 at 1:18:53 PM.    Final     Labs:  CBC: Recent Labs  03/14/21 2045  WBC 6.7  HGB 9.2*  HCT 31.1*  PLT 437*    COAGS: Recent Labs    03/14/21 2045  INR 0.9  APTT 31    BMP: Recent Labs    03/14/21 2045  NA 138  K 3.4*  CL 105  CO2 21*  GLUCOSE 95  BUN 8  CALCIUM 9.4  CREATININE 0.79  GFRNONAA >60    LIVER FUNCTION TESTS: Recent Labs    03/14/21 2045  BILITOT 0.3  AST 19  ALT 16  ALKPHOS 59  PROT 7.8  ALBUMIN 4.5     Assessment and Plan:  Right occipital AVM. Plan for image-guided diagnostic cerebral arteriogram today in IR. Patient is NPO. Afebrile. She does not take blood thinners. INR 1.0 today.  Risks and benefits of diagnostic cerebral angiogram were discussed with the patient including, but not limited to bleeding, infection, vascular injury, stroke, or contrast induced renal failure. This interventional procedure involves the use of X-rays and because of the nature of the planned procedure, it is possible that we will have prolonged use of X-ray fluoroscopy. Potential radiation risks to you include (but are not limited to) the following: - A slightly elevated risk for cancer  several years later in life. This risk is typically less than 0.5% percent. This  risk is low in comparison to the normal incidence of human cancer, which is 33% for women and 50% for men according to the [employer]. - Radiation induced injury can include skin redness, resembling a rash, tissue breakdown / ulcers and hair loss (which can be temporary or permanent).  The likelihood of either of these occurring depends on the difficulty of the procedure and whether you are sensitive to radiation due to previous procedures, disease, or genetic conditions.  IF your procedure requires a prolonged use of radiation, you will be notified and given written instructions for further action.  It is your responsibility to monitor the irradiated area for the 2 weeks following the procedure and to notify your physician if you are concerned that you have suffered a radiation induced injury.   All of the patient's questions were answered, patient is agreeable to proceed. Consent signed and in chart.   Thank you for this interesting consult.  I greatly enjoyed meeting Mahealani Sulak and look forward to participating in their care.  A copy of this report was sent to the requesting provider on this date.  Electronically Signed: Elwin Mocha, PA-C 04/05/2021, 9:59 AM   I spent a total of 25 Minutes in face to face in clinical consultation, greater than 50% of which was counseling/coordinating care for right occipital AVM/diagnostic cerebral arteriogram.

## 2021-04-05 NOTE — Discharge Instructions (Addendum)
Cerebral Angiogram, Care After This sheet gives you information about how to care for yourself after your procedure. Your health care provider may also give you more specific instructions. If you have problems or questions, contact your health care provider. What can I expect after the procedure? After the procedure, it is common to have:  Bruising and tenderness at the catheter insertion site.  A mild headache. Follow these instructions at home: Insertion site care  Follow instructions from your health care provider about how to take care of the insertion site. Make sure you: ? Wash your hands with soap and water before and after you change your bandage (dressing). If soap and water are not available, use hand sanitizer. ? Change your dressing as told by your health care provider.  Do not take baths, swim, or use a hot tub until your health care provider approves. You may shower 24-48 hours after the procedure, or as told by your health care provider.  To clean your insertion site: ? Gently wash the site with plain soap and water. ? Pat the area dry with a clean towel. ? Do not rub the site. This may cause bleeding.  Do not apply powder or lotion to the site. Keep the site clean and dry. Infection signs Check your incision area every day for signs of infection. Check for:  Redness, swelling, or pain.  Fluid or blood.  Warmth.  Pus or a bad smell.   Activity  Do not drive for 24 hours if you were given a sedative during your procedure.  Rest as told by your health care provider.  Do not lift anything that is heavier than 10 lb (4.5 kg), or the limit that you are told, until your health care provider says that it is safe.  Return to your normal activities as told by your health care provider, usually in about a week. Ask your health care provider what activities are safe for you. General instructions  If your insertion site starts to bleed, lie flat and put pressure on the  site. If the bleeding does not stop, get help right away. This is a medical emergency.  Do not use any products that contain nicotine or tobacco, such as cigarettes, e-cigarettes, and chewing tobacco. If you need help quitting, ask your health care provider.  Take over-the-counter and prescription medicines only as told by your health care provider.  Drink enough fluid to keep your urine pale yellow. This helps flush the contrast dye from your body.  Keep all follow-up visits as directed by your health care provider. This is important.   Contact a health care provider if:  You have a fever or chills.  You have redness, swelling, or pain around your insertion site.  You have fluid or blood coming from your insertion site.  The insertion site feels warm to the touch.  You have pus or a bad smell coming from your insertion site.  You notice blood collecting in the tissue around the insertion site (hematoma). The hematoma may be painful to the touch. Get help right away if:  You have chest pain or trouble breathing.  You have severe pain or swelling at the insertion site.  The insertion area bleeds, and bleeding continues after 30 minutes of holding steady pressure on the site.  The arm or leg where the catheter was inserted is pale, cold, numb, tingling, or weak.  You have a rash.  You have any symptoms of a stroke. "BE FAST" is   an easy way to remember the main warning signs of a stroke: ? B - Balance. Signs are dizziness, sudden trouble walking, or loss of balance. ? E - Eyes. Signs are trouble seeing or a sudden change in vision. ? F - Face. Signs are sudden weakness or numbness of the face, or the face or eyelid drooping on one side. ? A - Arms. Signs are weakness or numbness in an arm. This happens suddenly and usually on one side of the body. ? S - Speech. Signs are sudden trouble speaking, slurred speech, or trouble understanding what people say. ? T - Time. Time to call  emergency services. Write down what time symptoms started.  You have other signs of a stroke, such as: ? A sudden, severe headache with no known cause. ? Nausea or vomiting. ? Seizure. These symptoms may represent a serious problem that is an emergency. Do not wait to see if the symptoms will go away. Get medical help right away. Call your local emergency services (911 in the [place] Do not drive yourself to the hospital. Summary  Bruising and tenderness at the insertion site are common.  Follow your health care provider's instructions about caring for your insertion site. Change dressing and clean the area as instructed.  If your insertion site bleeds, apply direct pressure until bleeding stops.  Return to your normal activities as told by your health care provider. Ask what activities are safe.  Rest and drink plenty of fluids. This information is not intended to replace advice given to you by your health care provider. Make sure you discuss any questions you have with your health care provider. Document Revised: 05/24/2019 Document Reviewed: 05/24/2019 Elsevier Patient Education  2021 [employer] Moderate Conscious Sedation, Adult Sedation is the use of medicines to promote relaxation and to relieve discomfort and anxiety. Moderate conscious sedation is a type of sedation. Under moderate conscious sedation, you are less alert than normal, but you are still able to respond to instructions, touch, or both. Moderate conscious sedation is used during short medical and dental procedures. It is milder than deep sedation, which is a type of sedation under which you cannot be easily woken up. It is also milder than general anesthesia, which is the use of medicines to make you unconscious. Moderate conscious sedation allows you to return to your regular activities sooner. Tell a health care provider about:  Any allergies you have.  All medicines you are taking, including vitamins, herbs, eye  drops, creams, and over-the-counter medicines.  Any use of steroids. This includes steroids taken by mouth or as a cream.  Any problems you or family members have had with sedatives and anesthetic medicines.  Any blood disorders you have.  Any surgeries you have had.  Any medical conditions you have, such as sleep apnea.  Whether you are pregnant or may be pregnant.  Any use of cigarettes, alcohol, marijuana, or drugs. What are the risks? Generally, this is a safe procedure. However, problems may occur, including:  Getting too much medicine (oversedation).  Nausea.  Allergic reaction to medicines.  Trouble breathing. If this happens, a breathing tube may be used. It will be removed when you are awake and breathing on your own.  Heart trouble.  Lung trouble.  Confusion that gets better with time (emergence delirium). What happens before the procedure? Staying hydrated Follow instructions from your health care provider about hydration, which may include:  Up to 2 hours before the procedure - you may   continue to drink clear liquids, such as water, clear fruit juice, black coffee, and plain tea. Eating and drinking restrictions Follow instructions from your health care provider about eating and drinking, which may include:  8 hours before the procedure - stop eating heavy meals or foods, such as meat, fried foods, or fatty foods.  6 hours before the procedure - stop eating light meals or foods, such as toast or cereal.  6 hours before the procedure - stop drinking milk or drinks that contain milk.  2 hours before the procedure - stop drinking clear liquids. Medicines Ask your health care provider about:  Changing or stopping your regular medicines. This is especially important if you are taking diabetes medicines or blood thinners.  Taking medicines such as aspirin and ibuprofen. These medicines can thin your blood. Do not take these medicines unless your health care  provider tells you to take them.  Taking over-the-counter medicines, vitamins, herbs, and supplements. Tests and exams  You will have a physical exam.  You may have blood tests done to show how well: ? Your kidneys and liver work. ? Your blood clots. General instructions  Plan to have a responsible adult take you home from the hospital or clinic.  If you will be going home right after the procedure, plan to have a responsible adult care for you for the time you are told. This is important. What happens during the procedure?  You will be given the sedative. The sedative may be given: ? As a pill that you will swallow. It can also be inserted into the rectum. ? As a spray through the nose. ? As an injection into the muscle. ? As an injection into the vein through an IV.  You may be given oxygen as needed.  Your breathing, heart rate, and blood pressure will be monitored during the procedure.  The medical or dental procedure will be done. The procedure may vary among health care providers and hospitals.   What happens after the procedure?  Your blood pressure, heart rate, breathing rate, and blood oxygen level will be monitored until you leave the hospital or clinic.  You will get fluids through your IV if needed.  Do not drive or operate machinery until your health care provider says that it is safe. Summary  Sedation is the use of medicines to promote relaxation and to relieve discomfort and anxiety. Moderate conscious sedation is a type of sedation that is used during short medical and dental procedures.  Tell the health care provider about any medical conditions that you have and about all the medicines that you are taking.  You will be given the sedative as a pill, a spray through the nose, an injection into the muscle, or an injection into the vein through an IV. Vital signs are monitored during the sedation.  Moderate conscious sedation allows you to return to your  regular activities sooner. This information is not intended to replace advice given to you by your health care provider. Make sure you discuss any questions you have with your health care provider. Document Revised: 03/02/2020 Document Reviewed: 09/29/2019 Elsevier Patient Education  2021 Elsevier Inc[employer]  

## 2021-04-06 ENCOUNTER — Other Ambulatory Visit: Payer: Self-pay | Admitting: Adult Health

## 2021-04-06 DIAGNOSIS — Q2730 Arteriovenous malformation, site unspecified: Secondary | ICD-10-CM

## 2021-04-06 DIAGNOSIS — I63439 Cerebral infarction due to embolism of unspecified posterior cerebral artery: Secondary | ICD-10-CM

## 2021-04-06 MED ORDER — ASPIRIN 325 MG PO TABS: 325.0000 mg | ORAL_TABLET | Freq: Every day | ORAL | 2 refills | Status: AC

## 2021-04-06 MED ORDER — ATORVASTATIN CALCIUM 80 MG PO TABS: 80.0000 mg | ORAL_TABLET | Freq: Every day | ORAL | 2 refills | Status: AC

## 2021-04-06 NOTE — Progress Notes (Signed)
Occupational Therapy Evaluation Patient Details Name: Jodi Wood MRN: [id number] DOB: [birth year] Today's Date: 04/06/2021    History of Present Illness 40 y/o female had a diagnostic cerebral angiogram on 5/20 for a right occipital AVM on imaging which was discovered when she was admitted to [facility] hospital for 03/14/2021 to 03/15/2021 for headaches and loss of vision, right after the procedure today reported visual disturbance and blurred vision on the right side.  Since she just had an angiographic procedure done, high suspicion for stroke and a code stroke was called. 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 No significant PMH   Clinical Impression   Pt pleasant, A&Ox4, good safety. At baseline pt lives at home with husband and is indep with all ADL's/IADL's/mobility. Currently presenting with no acute focal deficits, demo's ability to complete all ADL's and transfers at indep level of performance with good safety. Family in room to confirm that help/S is available if needed, but at this time pt presents with no acute or post acute need for skilled OT intervention. OT will sign off at this time.     Follow Up Recommendations  No OT follow up    Equipment Recommendations  None recommended by OT    Recommendations for Other Services       Precautions / Restrictions        Mobility Bed Mobility Overal bed mobility: Independent                  Transfers Overall transfer level: Independent               General transfer comment: no equip, LOB or intervention from therapist required    Balance Overall balance assessment: Independent                                         ADL either performed or assessed with clinical judgement   ADL Overall ADL's : Independent                                       General ADL Comments: good safety and modification; limited in full assessment d/t bored placed to R  wrist for angio; overall indep with standing and sitting simulated in room     Vision Baseline Vision/History: No visual deficits Patient Visual Report: No change from baseline Vision Assessment?: No apparent visual deficits     Perception Perception Perception Tested?: No Spatial deficits: No   Praxis      Pertinent Vitals/Pain Pain Assessment: No/denies pain     Hand Dominance Right   Extremity/Trunk Assessment Upper Extremity Assessment Upper Extremity Assessment: Overall WFL for tasks assessed       Cervical / Trunk Assessment Cervical / Trunk Assessment: Normal   Communication Communication Communication: No difficulties   Cognition Arousal/Alertness: Awake/alert Behavior During Therapy: WFL for tasks assessed/performed Overall Cognitive Status: Within Functional Limits for tasks assessed                                 General Comments: no cognitive deficits present with testing, good safety   General Comments  wrapping and bored placed to R wrist    Exercises     Shoulder Instructions  Home Living Family/patient expects to be discharged to:: Private residence Living Arrangements: Spouse/significant other Available Help at Discharge: Family Type of Home: House Home Access: Stairs to enter Secretary/administrator of Steps: 1 Entrance Stairs-Rails: None Home Layout: Two level Alternate Level Stairs-Number of Steps: flight of stairs to basement   Bathroom Shower/Tub: Tub/shower unit;Walk-in shower   Bathroom Toilet: Standard Bathroom Accessibility: No   Home Equipment: None   Additional Comments: indep with all ADL's and IADL's at baseline working fullt ime      Prior Functioning/Environment Level of Independence: Independent                 OT Problem List:        OT Treatment/Interventions:      OT Goals(Current goals can be found in the care plan section) Acute Rehab OT Goals Patient Stated Goal: to go home  today OT Goal Formulation: All assessment and education complete, DC therapy Potential to Achieve Goals: Good  OT Frequency:     Barriers to D/C:            Co-evaluation              AM-PAC OT "6 Clicks" Daily Activity     Outcome Measure Help from another person eating meals?: None Help from another person taking care of personal grooming?: None Help from another person toileting, which includes using toliet, bedpan, or urinal?: None Help from another person bathing (including washing, rinsing, drying)?: None Help from another person to put on and taking off regular upper body clothing?: None Help from another person to put on and taking off regular lower body clothing?: None 6 Click Score: 24   End of Session Nurse Communication: Mobility status  Activity Tolerance: Patient tolerated treatment well Patient left: in bed;with family/visitor present  OT Visit Diagnosis: Unsteadiness on feet (R26.81)                Time: 1005-1020 OT Time Calculation (min): 15 min Charges:  OT General Charges $OT Visit: 1 Visit OT Evaluation $OT Eval Low Complexity: 1 Low  [name] OTR/L acute rehab services Office: [phone]  04/06/2021, 11:32 AM

## 2021-04-06 NOTE — Plan of Care (Signed)

## 2021-04-06 NOTE — Progress Notes (Signed)
Supervising Physician: Baldemar Lenis  Patient Status:  [facility] - In-pt  Chief Complaint: Follow up periprocedural bilateral punctate acute ischemic strokes post diagnostic cerebral angiogram in NIR on 5/20  Subjective:  Ms. Jodi Wood is sitting up in bed, she states she feels great and is anxious to go home. She denies any headache, blurry vision or other neurologic symptoms. She is asking when the right wrist splint/bandage can be removed.  Allergies: Patient has no known allergies.  Medications: Prior to Admission medications   Medication Sig Start Date End Date Taking? Authorizing Provider  acetaminophen (TYLENOL) 500 MG tablet Take 500-1,000 mg by mouth every 6 (six) hours as needed for moderate pain or headache.   Yes [provider]  Pediatric Multivitamins-Iron (FLINTSTONES PLUS IRON PO) Take 1 tablet by mouth daily.   Yes [provider]  ferrous sulfate 325 (65 FE) MG EC tablet Take 1 tablet (325 mg total) by mouth daily with breakfast. Patient not taking: No sig reported 03/15/21 04/14/21  Briant Cedar, MD  ibuprofen (ADVIL) 200 MG tablet Take 2 tablets (400 mg total) by mouth every 8 (eight) hours as needed for mild pain. Patient not taking: Reported on 04/01/2021 03/15/21   Briant Cedar, MD  vitamin B-12 1000 MCG tablet Take 1 tablet (1,000 mcg total) by mouth daily. Patient not taking: Reported on 04/01/2021 03/16/21 04/15/21  Briant Cedar, MD  pantoprazole (PROTONIX) 20 MG tablet Take 1 tablet (20 mg total) by mouth daily. Patient not taking: Reported on 02/21/2020 11/29/19 02/21/20  Hall-Potvin, [place], PA-C     Vital Signs: BP 129/80 (BP Location: Left Arm)   Pulse 71   Temp (!) 97.5 F (36.4 C) (Oral)   Resp 20   Ht 5' (1.524 m)   Wt 175 lb (79.4 kg)   LMP 03/15/2021   SpO2 100%   BMI 34.18 kg/m   Physical Exam Vitals and nursing note reviewed.  HENT:     Head: Normocephalic.  Cardiovascular:     Rate and  Rhythm: Normal rate and regular rhythm.     Comments: (+) right radial puncture site clean, dry, ace wrap and splint in place. Non tender, no active bleeding. Patient requesting wrap be removed if possible. Pulmonary:     Effort: Pulmonary effort is normal.     Breath sounds: Normal breath sounds.  Abdominal:     General: There is no distension.     Palpations: Abdomen is soft.     Tenderness: There is no abdominal tenderness.  Skin:    General: Skin is warm and dry.  Neurological:     Mental Status: She is alert and oriented to person, place, and time.     Cranial Nerves: No cranial nerve deficit.  Psychiatric:        Mood and Affect: Mood normal.        Behavior: Behavior normal.        Thought Content: Thought content normal.        Judgment: Judgment normal.     Imaging: MR BRAIN WO CONTRAST  Result Date: 04/05/2021 CLINICAL DATA:  Right occipital AVM. Catheter angiogram today. Headache, blurred vision. EXAM: MRI HEAD WITHOUT CONTRAST TECHNIQUE: Multiplanar, multiecho pulse sequences of the brain and surrounding structures were obtained without intravenous contrast. COMPARISON:  CT head 04/05/2021.  MRI head 03/15/2021 FINDINGS: Brain: Multiple small areas of restricted diffusion are present involving the frontal cortex bilaterally, and right insular cortex. There is an AVM with  small amount of hemorrhage, likely chronic but no restricted diffusion associated with the right occipital lobe. Small area of acute infarct in the high left parietal cortex. Ventricle size normal. Chiari malformation. Cerebellar tonsils show impaction and extend 15 mm below the foramen magnum. Vascular: Normal arterial flow voids at the skull base. Right occipital AVM. Skull and upper cervical spine: No focal skeletal lesion. Sinuses/Orbits: Paranasal sinuses clear.  Negative orbit Other: None IMPRESSION: Small areas of restricted diffusion in the frontal lobes bilaterally and in the right insular cortex  compatible with acute infarct, likely embolic Right occipital AVM without evidence of associated infarct. Small amount of chronic appearing hemorrhage associated with the AVM. Chiari malformation. These results were called by telephone at the time of interpretation on 04/05/2021 at 6:50 pm to provider Wilford Corner and de Melchor Amour, who verbally acknowledged these results. Electronically Signed   By: Marlan Palau M.D.   On: 04/05/2021 18:57   CT HEAD CODE STROKE WO CM  Result Date: 04/05/2021 CLINICAL DATA:  Code stroke. Headache. AVM right occipital lobe. Catheter angiogram today. EXAM: CT HEAD WITHOUT CONTRAST TECHNIQUE: Contiguous axial images were obtained from the base of the skull through the vertex without intravenous contrast. COMPARISON:  CT head 03/14/2021 FINDINGS: Brain: Chiari malformation. Cerebellar tonsils below the foramen magnum, unchanged from prior studies. Negative for hydrocephalus. Negative for acute infarct, hemorrhage, mass. Vascular: Negative for hyperdense vessel Skull: Negative Sinuses/Orbits: Paranasal sinuses clear.  Normal orbit Other: None ASPECTS (Alberta Stroke Program Early CT Score) - Ganglionic level infarction (caudate, lentiform nuclei, internal capsule, insula, M1-M3 cortex): 7 - Supraganglionic infarction (M4-M6 cortex): 3 Total score (0-10 with 10 being normal): 10 IMPRESSION: 1. No acute abnormality 2. Chiari malformation 3. ASPECTS is 10 4. Code stroke imaging results were communicated on 04/05/2021 at 5:42 pm to provider Wilford Corner via text page Electronically Signed   By: Marlan Palau M.D.   On: 04/05/2021 17:43    Labs:  CBC: Recent Labs    03/14/21 2045 04/05/21 1029 04/05/21 2004  WBC 6.7 5.2 7.6  HGB 9.2* 11.3* 11.0*  HCT 31.1* 36.6 35.1*  PLT 437* 302 299    COAGS: Recent Labs    03/14/21 2045 04/05/21 1029  INR 0.9 1.0  APTT 31  --     BMP: Recent Labs    03/14/21 2045 04/05/21 1029  NA 138 137  K 3.4* 3.8  CL 105 111  CO2 21* 21*   GLUCOSE 95 89  BUN 8 10  CALCIUM 9.4 8.9  CREATININE 0.79 0.91  GFRNONAA >60 >60    LIVER FUNCTION TESTS: Recent Labs    03/14/21 2045  BILITOT 0.3  AST 19  ALT 16  ALKPHOS 59  PROT 7.8  ALBUMIN 4.5    Assessment and Plan:  40 y/o F who presented to [facility] yesterday for outpatient diagnostic cerebral angiogram - post procedure she developed progressive blurred vision and headache which did not respond to pain medication. Code stroke was activated and MRI of the brain noted multiple punctate infarcts in the bilateral anterior circulation, likely periprocedural. Findings were discussed with neurology (Dr. Wilford Corner) who recommended admission for further workup.  She has had no further headaches or blurry vision since last night, she is feeling great and would like to go home. We discussed planned follow up with Dr Tommi Rumps Melchor Amour to which she states understanding.  Patient ok for d/c from [facility] perspective, will defer d/c plans to neurology/TRH. I have instructed the RN that she  may remove the right radial puncture site splint/ACE wrap.  Please call IR with questions or concerns.  Electronically Signed: Villa Herb, PA-C 04/06/2021, 12:02 PM   I spent a total of 15 Minutes at the the patient's bedside AND on the patient's hospital floor or unit, greater than 50% of which was counseling/coordinating care for follow up periprocedural infarcts post dx cerebral angiogram.

## 2021-04-06 NOTE — Progress Notes (Signed)
PT Cancellation Note  Patient Details Name: Jodi Wood MRN: [id number] DOB: Apr 29, 1981   Cancelled Treatment:    Reason Eval/Treat Not Completed: PT screened, no needs identified, will sign off Per OT, patient ambulating independently with no balance deficits. PT will sign off.   Murad Staples A. Dan Humphreys PT, DPT Acute Rehabilitation Services Pager [phone] Office [phone]    [name] 04/06/2021, 11:52 AM

## 2021-04-06 NOTE — Progress Notes (Signed)
STROKE TEAM PROGRESS NOTE   INTERVAL HISTORY No acute events overnight  She relates that yesterday she could not see after procedure due blurred vision and also had a bad headache.  Today she reports she is feeling back to normal. She denies new concerns. She verbalizes understanding of need for follow up to treat AVM. We discussed her stroke diagnosis, diagnostic work up and plan of care. Her questions were addressed.   Vitals:   04/05/21 2151 04/05/21 2337 04/06/21 0331 04/06/21 0758  BP: 114/76 122/70 120/65 123/77  Pulse: 82 83 82 87  Resp: 20 19 18 18   Temp: (!) 97.5 F (36.4 C) 98.1 F (36.7 C) 98.6 F (37 C) 98.2 F (36.8 C)  TempSrc: Oral Oral Oral Oral  SpO2: 100% 100% 100% 100%  Weight:      Height:       CBC:  Recent Labs  Lab 04/05/21 1029 04/05/21 2004  WBC 5.2 7.6  HGB 11.3* 11.0*  HCT 36.6 35.1*  MCV 63.1* 62.2*  PLT 302 299   Basic Metabolic Panel:  Recent Labs  Lab 04/05/21 1029  NA 137  K 3.8  CL 111  CO2 21*  GLUCOSE 89  BUN 10  CREATININE 0.91  CALCIUM 8.9   Lipid Panel: No results for input(s): CHOL, TRIG, HDL, CHOLHDL, VLDL, LDLCALC in the last 168 hours. HgbA1c: No results for input(s): HGBA1C in the last 168 hours. Urine Drug Screen: No results for input(s): LABOPIA, COCAINSCRNUR, LABBENZ, AMPHETMU, THCU, LABBARB in the last 168 hours.  Alcohol Level No results for input(s): ETH in the last 168 hours.  IMAGING and PERTINENT DIAGNOSTICS  CT-scan of the brain-no acute changes  MRI examination of the brain-bilateral punctate embolic-looking infarcts, in the right insula, right frontal white matter, right superior frontal cortex and left superior frontal cortex. SWI of images reveal stable nidus of abnormal vessel with surrounding abscess in the right occipital lobe-likely the AVM.  Noncontrast head CT was unremarkable for acute process  PHYSICAL EXAM GENERAL: Awake, alert in NAD but somewhat anxious appearing HEENT: - Normocephalic  and atraumatic, dry mm, no LN++, no Thyromegally LUNGS - Clear to auscultation bilaterally with no wheezes CV - S1S2 RRR, no m/r/g, equal pulses bilaterally. ABDOMEN - Soft, nontender, nondistended with normoactive BS Ext: warm, well perfused, intact peripheral pulses, no edema.  Right arm in bandage from the radial side closure.  NEURO:  Mental Status: AA&Ox3  Language: speech is clear and not dysarthric.  Naming, repetition, fluency, and comprehension intact. Cranial Nerves: PERRL.  Visual acuity is blurry with the right eye but fine with the left eye.  EOMI, visual fields full, no facial asymmetry, facial sensation intact, hearing intact, tongue/uvula/soft palate midline, normal sternocleidomastoid and trapezius muscle strength. No evidence of tongue atrophy or fibrillations Motor: 5/5 without drift in all fours Tone: is normal and bulk is norma Sensation- Intact to light touch bilaterally Coordination: FTN intact bilaterally, no ataxia in BLE. Gait- deferred  NIHSS-0  ASSESSMENT/PLAN Jodi Wood is a 40 y.o. female who has a past medical history of anemia, who had a diagnostic cerebral angiogram for a right occipital AVM on imaging which was discovered when she was admitted to [facility] long hospital for 03/14/2021 to 03/15/2021 for headaches and loss of vision, right after the procedure today reported visual disturbance and blurred vision on the right side.  Since she just had an angiographic procedure done, high suspicion for stroke and a code stroke was called.  Embolic bilateral punctate acute ischemic strokes which are likely periprocedural.   Code Stroke CT head  No acute abnormality.    MRI Small areas of restricted diffusion in the frontal lobes bilaterally and in the right insular cortex compatible with acute infarct, likely embolic Right occipital AVM without evidence of associated infarct. Small amount of chronic appearing hemorrhage associated with the  AVM.Chiari malformation.  2D Echo: 2D echo was done on 03/15/2021-no need to repeat. EF 60-65%. No thrombus, wall motion abnormality or shunt found.   LDL 83  HgbA1c 5.6  VTE prophylaxis - per primary team    Diet   Diet Heart Room service appropriate? Yes; Fluid consistency: Thin   Recommend ASA 325 mg  alone   No AC/AP prior to admission  Therapy recommendations:  Home  Disposition:  Home  Follow up with Dr. Pearlean Brownie in 4 weeks  Hypertension . Permissive hypertension (OK if < 220/120) but gradually normalize in 5-7 days . Long-term BP goal normotensive  Hyperlipidemia  Home meds: resumed in hospital  LDL 83, goal < 70  High intensity statin: Lipitor 80mg    Continue statin at discharge  Diabetes type II Uncontrolled  Home meds:    HgbA1c 5.6, goal < 7.0  CBGs  No results for input(s): GLUCAP in the last 72 hours.   SSI  Other Stroke Risk Factors  Obesity, Body mass index is 34.18 kg/m., BMI >/= 30 associated with increased stroke risk, recommend weight loss, diet and exercise as appropriate   Family hx stroke: uknown, patient is adopted  Migraines  Other Active Problems  None  Hospital day # 1 I have personally obtained history,examined this patient, reviewed notes, independently viewed imaging studies, participated in medical decision making and plan of care.ROS completed by me personally and pertinent positives fully documented  I have made any additions or clarifications directly to the above note. Agree with note above.  Patient developed transient headache and blurred vision following diagnostic cerebral catheter angiogram for her AVM and symptoms appear to have resolved completely back to baseline.  Recommend aspirin alone for stroke prevention and elective follow-up with the neuroradiologist for AVM endovascular treatment.  Recommend low-dose statin for mildly elevated LDL and patient counseled to eat a healthy diet and lose weight.  Discussed with  patient and her godmother at the bedside.  Greater than 50% time during this 35-minute visit was spent in counseling and coordination of care and discussion with care team.  Discussed with Dr. , MD Medical Director [facility] Stroke [facility] Pager: [phone] 04/06/2021 5:03 PM   To contact Stroke Continuity provider, please refer to 04/08/2021. After hours, contact General Neurology

## 2021-04-06 NOTE — Progress Notes (Signed)
Pt medically stable for discharge per MD order. IV and tele removed. Belongings: cell phone, charger, purse. Discharge paperwork reviewed with patient and family, all questions answered. Pt leaving for home.
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
## 2021-04-08 ENCOUNTER — Other Ambulatory Visit (HOSPITAL_COMMUNITY): Payer: Self-pay | Admitting: Neuroradiology

## 2021-04-08 DIAGNOSIS — Q2730 Arteriovenous malformation, site unspecified: Secondary | ICD-10-CM

## 2021-04-08 HISTORY — PX: IR US GUIDE VASC ACCESS RIGHT: IMG2390

## 2021-04-08 HISTORY — PX: IR ANGIO VERTEBRAL SEL VERTEBRAL BILAT MOD SED: IMG5369

## 2021-04-09 ENCOUNTER — Other Ambulatory Visit: Payer: Self-pay

## 2021-04-09 ENCOUNTER — Ambulatory Visit (HOSPITAL_COMMUNITY)
Admission: RE | Admit: 2021-04-09 | Discharge: 2021-04-09 | Disposition: A | Discharge status: Home / Self Care | Payer: BC Managed Care – PPO | Source: Ambulatory Visit | Attending: Neuroradiology | Admitting: Neuroradiology

## 2021-04-09 DIAGNOSIS — Q2730 Arteriovenous malformation, site unspecified: Secondary | ICD-10-CM

## 2021-04-09 NOTE — Progress Notes (Signed)
Chief Complaint: The patient is seen in follow up today s/p diagnostic cerebral angiogram to evaluate right occipital AVM.  History of present illness:  Jodi Wood is a 40 year old female with a past medical history significant for anemia who presented to the emergency department on 03/14/21 complaining of transient left hemianopsia with acute onset while driving and lasting approximately 45 minutes.  This was followed by moderate right-sided headache.  She denies weakness or sensory changes.  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  She underwent a diagnostic cerebral angiogram on 04/05/2021 to evaluate for right occipital AVM.  She developed blurry vision and headache after the angiogram.  MRI of the brain showed multiple punctate infarcts in the bilateral anterior circulation, likely periprocedural.  She was admitted for observation and her symptoms completely resolve the next morning.  She comes today to discuss findings of the angiogram and management options.   No past medical history on file.  Past Surgical History:  Procedure Laterality Date  . CESAREAN SECTION    . IR ANGIO INTRA EXTRACRAN SEL INTERNAL CAROTID BILAT MOD SED  04/05/2021  . IR ANGIO VERTEBRAL SEL VERTEBRAL BILAT MOD SED  04/08/2021  . IR US GUIDE VASC ACCESS RIGHT  04/08/2021    Allergies: Patient has no known allergies.  Medications: Prior to Admission medications   Medication Sig Start Date End Date Taking? Authorizing Provider  acetaminophen (TYLENOL) 500 MG tablet Take 500-1,000 mg by mouth every 6 (six) hours as needed for moderate pain or headache.    [provider]  aspirin 325 MG tablet Take 1 tablet (325 mg total) by mouth daily. 04/07/21   Pokhrel, Corrie Mckusick, MD  atorvastatin (LIPITOR) 80 MG tablet Take 1 tablet (80 mg total) by mouth daily. 04/07/21   Pokhrel, Corrie Mckusick, MD  ferrous sulfate 325 (65 FE) MG EC tablet Take 1 tablet (325 mg total) by mouth daily with  breakfast. Patient not taking: No sig reported 03/15/21 04/14/21  Alma Friendly, MD  Pediatric Multivitamins-Iron (FLINTSTONES PLUS IRON PO) Take 1 tablet by mouth daily.    [provider]  vitamin B-12 1000 MCG tablet Take 1 tablet (1,000 mcg total) by mouth daily. Patient not taking: Reported on 04/01/2021 03/16/21 04/15/21  Alma Friendly, MD  pantoprazole (PROTONIX) 20 MG tablet Take 1 tablet (20 mg total) by mouth daily. Patient not taking: Reported on 02/21/2020 11/29/19 02/21/20  Hall-Potvin, [place], PA-C     Family History  Adopted: Yes    Social History   Socioeconomic History  . Marital status: Married    Spouse name: Not on file  . Number of children: Not on file  . Years of education: Not on file  . Highest education level: Not on file  Occupational History  . Not on file  Tobacco Use  . Smoking status: Never Smoker  . Smokeless tobacco: Never Used  Substance and Sexual Activity  . Alcohol use: Not Currently    Comment: 3 x a year  . Drug use: Never  . Sexual activity: Not on file  Other Topics Concern  . Not on file  Social History Narrative  . Not on file   Social Determinants of Health   Financial Resource Strain: Not on file  Food Insecurity: Not on file  Transportation Needs: Not on file  Physical Activity: Not on file  Stress: Not on file  Social Connections: Not on file     Vital Signs: LMP 03/15/2021  Physical Exam Constitutional:      Appearance: Normal appearance.  Eyes:     Extraocular Movements: Extraocular movements intact.     Pupils: Pupils are equal, round, and reactive to light.  Neurological:     Mental Status: She is alert and oriented to person, place, and time. Mental status is at baseline.     Cranial Nerves: No cranial nerve deficit.     Sensory: No sensory deficit.     Motor: No weakness.  Psychiatric:        Mood and Affect: Mood normal.        Behavior: Behavior normal.        Thought Content:  Thought content normal.        Judgment: Judgment normal.     Imaging: MR BRAIN WO CONTRAST  Result Date: 04/05/2021 CLINICAL DATA:  Right occipital AVM. Catheter angiogram today. Headache, blurred vision. EXAM: MRI HEAD WITHOUT CONTRAST TECHNIQUE: Multiplanar, multiecho pulse sequences of the brain and surrounding structures were obtained without intravenous contrast. COMPARISON:  CT head 04/05/2021.  MRI head 03/15/2021 FINDINGS: Brain: Multiple small areas of restricted diffusion are present involving the frontal cortex bilaterally, and right insular cortex. There is an AVM with small amount of hemorrhage, likely chronic but no restricted diffusion associated with the right occipital lobe. Small area of acute infarct in the high left parietal cortex. Ventricle size normal. Chiari malformation. Cerebellar tonsils show impaction and extend 15 mm below the foramen magnum. Vascular: Normal arterial flow voids at the skull base. Right occipital AVM. Skull and upper cervical spine: No focal skeletal lesion. Sinuses/Orbits: Paranasal sinuses clear.  Negative orbit Other: None IMPRESSION: Small areas of restricted diffusion in the frontal lobes bilaterally and in the right insular cortex compatible with acute infarct, likely embolic Right occipital AVM without evidence of associated infarct. Small amount of chronic appearing hemorrhage associated with the AVM. Chiari malformation. These results were called by telephone at the time of interpretation on 04/05/2021 at 6:50 pm to provider Rory Percy and Buhler, who verbally acknowledged these results. Electronically Signed   By: Franchot Gallo M.D.   On: 04/05/2021 18:57   IR US Guide Vasc Access Right  Result Date: 04/08/2021 INDICATION: Jodi Wood is a 40 year old female with a past medical history significant for anemia who presented to the emergency department on 03/14/21 complaining of transient left hemianopsia with acute onset while driving  and lasting approximately 45 minutes. This was followed by moderate right-sided headache. She denies weakness or sensory changes. She refers a similar episode about a year ago. A head CT was obtained which was negative for acute infarct or hemorrhage. Brain MRI was performed and showed prominent vessels in the right occipital lobe concern for possible AVM. CT angiogram of the head and neck was also concerning for a right occipital lobe AVM. She comes to our service for a diagnostic cerebral angiogram to evaluate imaging findings. EXAM: ULTRASOUND-GUIDED VASCULAR ACCESS DIAGNOSTIC CEREBRAL ANGIOGRAM COMPARISON:  CT/CT angiogram March 15, 2021. MEDICATIONS: 300 mcg nitroglycerin, 5,000 units heparin, 5 mg verapamil intra-arterially to the right radial artery. ANESTHESIA/SEDATION: Versed 1 mg IV; Fentanyl 25 mcg IV. 200 mcg of nitroglycerin; 1 mL of lidocaine 1% subcutaneous. Moderate Sedation Time:  58 minutes. The patient was continuously monitored during the procedure by the interventional radiology nurse under my direct supervision. CONTRAST:  65 mL of Omnipaque 240 milligram/mL FLUOROSCOPY TIME:  Fluoroscopy Time: 14 minutes 12 seconds (767 mGy). COMPLICATIONS: SIR LEVEL B -  Normal therapy, includes overnight admission for observation. TECHNIQUE: Informed written consent was obtained from the patient after a thorough discussion of the procedural risks, benefits and alternatives. All questions were addressed. Maximal Sterile Barrier Technique was utilized including caps, mask, sterile gowns, sterile gloves, sterile drape, hand hygiene and skin antiseptic. A timeout was performed prior to the initiation of the procedure. Using the modified Seldinger technique and a micropuncture kit, access was gained to the distal right radial artery at the anatomical snuffbox and a 5 French sheath was placed. Slow intra arterial infusion of 5,000 IU heparin, 5 mg Verapamil and 245 mcg nitroglicerin diluted in patient's own blood  was performed. No significant fluctuation in patient's blood pressure seen. Then, a right radial artery roadmap was obtained via sheath side port. Normal brachial artery branching pattern seen. No significant anatomical variation. The right radial artery caliber is adequate for vascular access. Next, a 5 [place] Simmons 2 glide catheter was navigated over a 0.035" Terumo Glidewire into the right subclavian artery under fluoroscopic guidance. The catheter was then advanced into the descending aortic arch where the catheter tip was reformed. The catheter was then placed into the left common carotid artery. Frontal and lateral angiograms of the neck were obtained. Using biplane roadmap, the catheter was placed into the left internal carotid artery. Frontal and lateral angiograms of the head were obtained. The catheter was retracted into the left common carotid artery and under biplane roadmap advanced into the left external carotid artery. Frontal and lateral angiograms of the head were obtained. Next, the catheter was placed into the left vertebral artery. Frontal and lateral angiograms of the head were obtained. Subsequently, the catheter was placed into the right common carotid artery. Frontal and lateral angiograms of the neck were obtained. Under biplane roadmap, the catheter was placed into the right internal carotid artery. Frontal and lateral angiograms of the head were obtained. The catheter was then retracted into the right common carotid artery and then advanced into the right external carotid artery. Frontal and lateral angiograms of the head were obtained. The catheter was then placed in the right vertebral artery. Frontal lateral angiograms were obtained followed by multiple magnified oblique views, centered on the right occipital lobe. The catheter was subsequently withdrawn. An inflatable band was placed and inflated over the right hand access site. The vascular sheath was withdrawn and the band was  slowly deflated until brisk flow was noted through the arteriotomy site. At this point, the band was reinflated with additional 2 cc of air to obtain patent hemostasis. FINDINGS: Right radial artery ultrasound and right radial artery angiogram: The caliber of the distal right radial artery is appropriate for angiogram access. The right radial artery and the right ulnar artery have normal course and caliber. No significant anatomical variants noted. Left CCA angiograms: Cervical angiograms show normal course and caliber of the visualized left common carotid and internal carotid arteries. There are no significant stenoses. Left ICA angiograms: There is brisk vascular contrast filling of the the ACA and MCA vascular trees. Luminal caliber is smooth and tapering. No aneurysms or abnormally high-flow, early draining veins are seen. No regions of abnormal hypervascularity are noted. The visualized dural sinuses are patent. Left ECA angiograms: No early venous drainage was noted. The intracranial branches of the left external carotid artery are unremarkable. Left vertebral artery angiograms: Arteriovenous malformation in the right occipital lobe supplied by both calcarine and parietooccipital branches of the right PCA and draining into a single cortical vein.  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 No intra nidal aneurysm identified. Nidus measures approximately 1.5 x 0.5 cm. Otherwise, the left vertebral artery, basilar artery, and bilateral posterior cerebral arteries are unremarkable. Luminal caliber is smooth and tapering. No aneurysms are seen. The visualized dural sinuses are patent. Right CCA angiograms: Cervical angiograms show normal course and caliber of the visualized right common carotid and internal carotid arteries. There are no significant stenoses. Right ICA angiograms: There is brisk vascular contrast filling of the ACA and MCA vascular trees. Luminal caliber is smooth and tapering. No aneurysms or abnormally  high-flow, early draining veins are seen. No regions of abnormal hypervascularity are noted. The visualized dural sinuses are patent. Right ECA and right occipital angiograms: No early venous drainage was noted. The intracranial branches of the right external carotid artery are unremarkable. Right vertebral artery angiograms: Again demonstrated arteriovenous malformation in the right occipital lobe supplied by both calcarine and parietooccipital branches of the right PCA and draining into a single cortical vein. 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 No intra nidal aneurysm identified. Nidus measures approximately 1.5 x 0.5 cm. Otherwise, the right vertebral artery, basilar artery, and bilateral posterior cerebral arteries are unremarkable. Luminal caliber is smooth and tapering. No aneurysms are seen. No regions of abnormal hypervascularity are noted. The visualized dural sinuses are patent. PROCEDURE: No intervention performed. 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 No other significant vascular abnormality. PLAN: Follow-up consult within 2 weeks to discuss management. Electronically Signed   By: Pedro Earls M.D.   On: 04/08/2021 11:08   CT HEAD CODE STROKE WO CM  Result Date: 04/05/2021 CLINICAL DATA:  Code stroke. Headache. AVM right occipital lobe. Catheter angiogram today. EXAM: CT HEAD WITHOUT CONTRAST TECHNIQUE: Contiguous axial images were obtained from the base of the skull through the vertex without intravenous contrast. COMPARISON:  CT head 03/14/2021 FINDINGS: Brain: Chiari malformation. Cerebellar tonsils below the foramen magnum, unchanged from prior studies. Negative for hydrocephalus. Negative for acute infarct, hemorrhage, mass. Vascular: Negative for hyperdense vessel Skull: Negative Sinuses/Orbits: Paranasal sinuses clear.  Normal orbit Other: None  ASPECTS ([facility] Lopez Stroke Program Early CT Score) - Ganglionic level infarction (caudate, lentiform nuclei, internal capsule, insula, M1-M3 cortex): 7 - Supraganglionic infarction (M4-M6 cortex): 3 Total score (0-10 with 10 being normal): 10 IMPRESSION: 1. No acute abnormality 2. Chiari malformation 3. ASPECTS is 10 4. Code stroke imaging results were communicated on 04/05/2021 at 5:42 pm to provider Rory Percy via text page Electronically Signed   By: Franchot Gallo M.D.   On: 04/05/2021 17:43   IR ANGIO VERTEBRAL SEL VERTEBRAL BILAT MOD SED  Result Date: 04/08/2021 INDICATION: Jodi Wood is a 40 year old female with a past medical history significant for anemia who presented to the emergency department on 03/14/21 complaining of transient left hemianopsia with acute onset while driving and lasting approximately 45 minutes. This was followed by moderate right-sided headache. She denies weakness or sensory changes. She refers a similar episode about a year ago. A head CT was obtained which was negative for acute infarct or hemorrhage. Brain MRI was performed and showed prominent vessels in the right occipital lobe concern for possible AVM. CT angiogram of the head and neck was also concerning for a right occipital lobe AVM. She comes to our service for a diagnostic cerebral angiogram to evaluate imaging findings. EXAM: ULTRASOUND-GUIDED  VASCULAR ACCESS DIAGNOSTIC CEREBRAL ANGIOGRAM COMPARISON:  CT/CT angiogram March 15, 2021. MEDICATIONS: 300 mcg nitroglycerin, 5,000 units heparin, 5 mg verapamil intra-arterially to the right radial artery. ANESTHESIA/SEDATION: Versed 1 mg IV; Fentanyl 25 mcg IV. 200 mcg of nitroglycerin; 1 mL of lidocaine 1% subcutaneous. Moderate Sedation Time:  58 minutes. The patient was continuously monitored during the procedure by the interventional radiology nurse under my direct supervision. CONTRAST:  65 mL of Omnipaque 240 milligram/mL FLUOROSCOPY TIME:  Fluoroscopy Time: 14 minutes  12 seconds (767 mGy). COMPLICATIONS: SIR LEVEL B - Normal therapy, includes overnight admission for observation. TECHNIQUE: Informed written consent was obtained from the patient after a thorough discussion of the procedural risks, benefits and alternatives. All questions were addressed. Maximal Sterile Barrier Technique was utilized including caps, mask, sterile gowns, sterile gloves, sterile drape, hand hygiene and skin antiseptic. A timeout was performed prior to the initiation of the procedure. Using the modified Seldinger technique and a micropuncture kit, access was gained to the distal right radial artery at the anatomical snuffbox and a 5 French sheath was placed. Slow intra arterial infusion of 5,000 IU heparin, 5 mg Verapamil and 696 mcg nitroglicerin diluted in patient's own blood was performed. No significant fluctuation in patient's blood pressure seen. Then, a right radial artery roadmap was obtained via sheath side port. Normal brachial artery branching pattern seen. No significant anatomical variation. The right radial artery caliber is adequate for vascular access. Next, a 5 [place] Simmons 2 glide catheter was navigated over a 0.035" Terumo Glidewire into the right subclavian artery under fluoroscopic guidance. The catheter was then advanced into the descending aortic arch where the catheter tip was reformed. The catheter was then placed into the left common carotid artery. Frontal and lateral angiograms of the neck were obtained. Using biplane roadmap, the catheter was placed into the left internal carotid artery. Frontal and lateral angiograms of the head were obtained. The catheter was retracted into the left common carotid artery and under biplane roadmap advanced into the left external carotid artery. Frontal and lateral angiograms of the head were obtained. Next, the catheter was placed into the left vertebral artery. Frontal and lateral angiograms of the head were obtained. Subsequently, the  catheter was placed into the right common carotid artery. Frontal and lateral angiograms of the neck were obtained. Under biplane roadmap, the catheter was placed into the right internal carotid artery. Frontal and lateral angiograms of the head were obtained. The catheter was then retracted into the right common carotid artery and then advanced into the right external carotid artery. Frontal and lateral angiograms of the head were obtained. The catheter was then placed in the right vertebral artery. Frontal lateral angiograms were obtained followed by multiple magnified oblique views, centered on the right occipital lobe. The catheter was subsequently withdrawn. An inflatable band was placed and inflated over the right hand access site. The vascular sheath was withdrawn and the band was slowly deflated until brisk flow was noted through the arteriotomy site. At this point, the band was reinflated with additional 2 cc of air to obtain patent hemostasis. FINDINGS: Right radial artery ultrasound and right radial artery angiogram: The caliber of the distal right radial artery is appropriate for angiogram access. The right radial artery and the right ulnar artery have normal course and caliber. No significant anatomical variants noted. Left CCA angiograms: Cervical angiograms show normal course and caliber of the visualized left common carotid and internal carotid arteries. There are no significant stenoses. Left  ICA angiograms: There is brisk vascular contrast filling of the the ACA and MCA vascular trees. Luminal caliber is smooth and tapering. No aneurysms or abnormally high-flow, early draining veins are seen. No regions of abnormal hypervascularity are noted. The visualized dural sinuses are patent. Left ECA angiograms: No early venous drainage was noted. The intracranial branches of the left external carotid artery are unremarkable. Left vertebral artery angiograms: Arteriovenous malformation in the right  occipital lobe supplied by both calcarine and parietooccipital branches of the right PCA and draining into a single cortical vein. 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 No intra nidal aneurysm identified. Nidus measures approximately 1.5 x 0.5 cm. Otherwise, the left vertebral artery, basilar artery, and bilateral posterior cerebral arteries are unremarkable. Luminal caliber is smooth and tapering. No aneurysms are seen. The visualized dural sinuses are patent. Right CCA angiograms: Cervical angiograms show normal course and caliber of the visualized right common carotid and internal carotid arteries. There are no significant stenoses. Right ICA angiograms: There is brisk vascular contrast filling of the ACA and MCA vascular trees. Luminal caliber is smooth and tapering. No aneurysms or abnormally high-flow, early draining veins are seen. No regions of abnormal hypervascularity are noted. The visualized dural sinuses are patent. Right ECA and right occipital angiograms: No early venous drainage was noted. The intracranial branches of the right external carotid artery are unremarkable. Right vertebral artery angiograms: Again demonstrated arteriovenous malformation in the right occipital lobe supplied by both calcarine and parietooccipital branches of the right PCA and draining into a single cortical vein. 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 No intra nidal aneurysm identified. Nidus measures approximately 1.5 x 0.5 cm. Otherwise, the right vertebral artery, basilar artery, and bilateral posterior cerebral arteries are unremarkable. Luminal caliber is smooth and tapering. No aneurysms are seen. No regions of abnormal hypervascularity are noted. The visualized dural sinuses are patent. PROCEDURE: No intervention performed. 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 No other significant vascular  abnormality. PLAN: Follow-up consult within 2 weeks to discuss management. Electronically Signed   By: Pedro Earls M.D.   On: 04/08/2021 11:08    Labs:  CBC: Recent Labs    03/14/21 2045 04/05/21 1029 04/05/21 2004  WBC 6.7 5.2 7.6  HGB 9.2* 11.3* 11.0*  HCT 31.1* 36.6 35.1*  PLT 437* 302 299    COAGS: Recent Labs    03/14/21 2045 04/05/21 1029  INR 0.9 1.0  APTT 31  --     BMP: Recent Labs    03/14/21 2045 04/05/21 1029  NA 138 137  K 3.4* 3.8  CL 105 111  CO2 21* 21*  GLUCOSE 95 89  BUN 8 10  CALCIUM 9.4 8.9  CREATININE 0.79 0.91  GFRNONAA >60 >60    LIVER FUNCTION TESTS: Recent Labs    03/14/21 2045  BILITOT 0.3  AST 19  ALT 16  ALKPHOS 59  PROT 7.8  ALBUMIN 4.5    Assessment:  I discussed the imaging findings of the recent cerebral angiogram with Mrs. Boman and her spouse.  She has a small arteriovenous malformation in her right occipital lobe, precisely in the calcarine fissure.  Arterial supply to the AVM is from right PCA branches and it drains to a single cortical vein Barbette Hair grade 2).  Small susceptibility artifact was seen on recent MRI of the brain within the  AVM suggesting prior micro bleed, age indeterminate.  This was not appreciable on prior MRI due to susceptibility artifact from Feraheme use at the day of the MRI acquisition.  I explained to Mrs. Pates that endovascular treatment of her AVM carries a significant risk of vision impairment due to its location.  She does not want to consider open surgical treatment.  I have suggested conservative management versus radiosurgery.  She would like to consider her options before making a decision in terms of management.  I instructed her to call our office with any questions or concerns and let us know when she is ready to make a decision.  Referral will be placed as appropriate depending upon her decision.    Signed: Pedro Earls, MD 04/09/2021, 4:25  PM    I spent a total of  25 Minutes in face to face in clinical consultation, greater than 50% of which was counseling/coordinating care for right occipital AVM.

## 2021-04-17 ENCOUNTER — Telehealth (HOSPITAL_COMMUNITY): Payer: Self-pay | Admitting: Radiology

## 2021-04-17 NOTE — Telephone Encounter (Signed)
Called pt to see if she has made a decision about treatment of her AVM with Dr. Quay Burow. Pt states that she is still thinking about it and will call back next week with her decision. JM

## 2021-06-27 ENCOUNTER — Inpatient Hospital Stay: Payer: BC Managed Care – PPO | Admitting: Neurology

## 2021-07-10 ENCOUNTER — Encounter: Payer: Self-pay | Admitting: Neurology

## 2021-07-10 ENCOUNTER — Inpatient Hospital Stay: Payer: BC Managed Care – PPO | Admitting: Neurology

## 2021-12-09 IMAGING — XA IR CAROTID INTERNAL HEAD/NECK BILAT  (MS)
12 of 20 series · 12 of 24 positions shown · IV contrast (IODINE)
Comparison: CT/CT angiogram March 15, 2021.

INDICATION: Alia Tiger is a 39 year old female with a past medical
history significant for anemia who presented to the emergency
department on 03/14/21 complaining of transient left hemianopsia with
acute onset while driving and lasting approximately 45 minutes. This
was followed by moderate right-sided headache. She denies weakness
or sensory changes. She refers a similar episode about a year ago. A
head CT was obtained which was negative for acute infarct or
hemorrhage. Brain MRI was performed and showed prominent vessels in
the right occipital lobe concern for possible AVM. CT angiogram of
the head and neck was also concerning for a right occipital lobe
AVM. She comes to our service for a diagnostic cerebral angiogram to
evaluate imaging findings.

EXAM:
ULTRASOUND-GUIDED VASCULAR [REDACTED] CEREBRAL ANGIOGRAM
TECHNIQUE: Informed written consent was obtained from the patient after a
thorough discussion of the procedural risks, benefits and
alternatives. All questions were addressed.

[Series 1: fl neuro · 1 of 9 frames shown]
[frame 9/9]
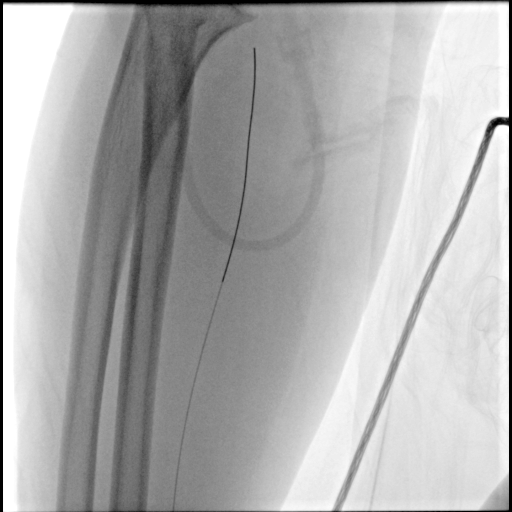

[Series 3: cerebral · 4 acquisitions, 1 frame shown (1 of 2)]
[im 1/4]
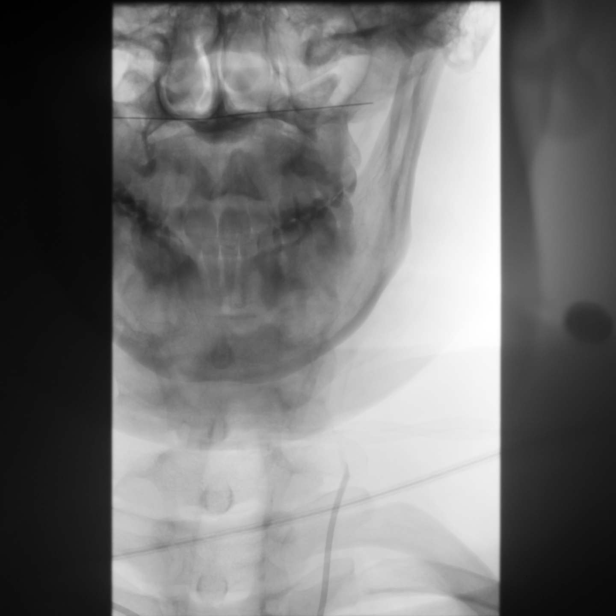

[Series 5: d roadmap · 1 of 130 frames shown (1 of 2)]
[frame 66/130]
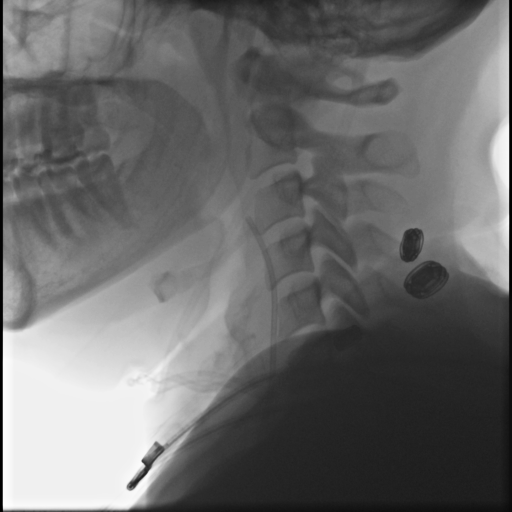

[Series 7: cerebral · 4 acquisitions, 1 frame shown (2 of 2)]
[im 1/4]
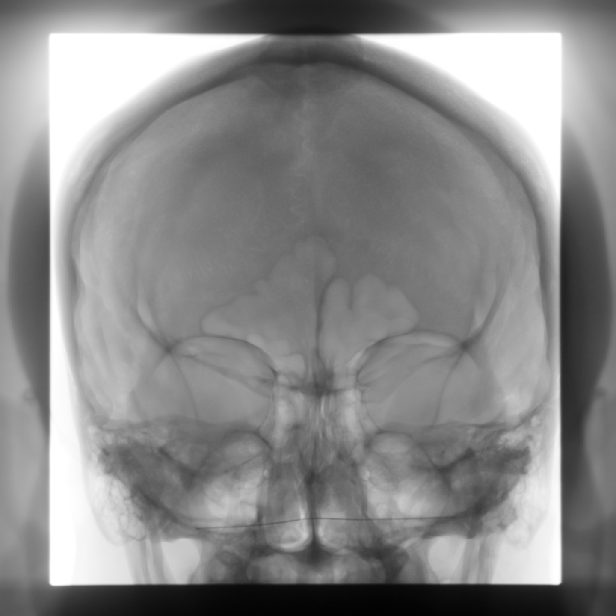

[Series 8: d roadmap · 1 of 31 frames shown (2 of 2)]
[frame 27/31]
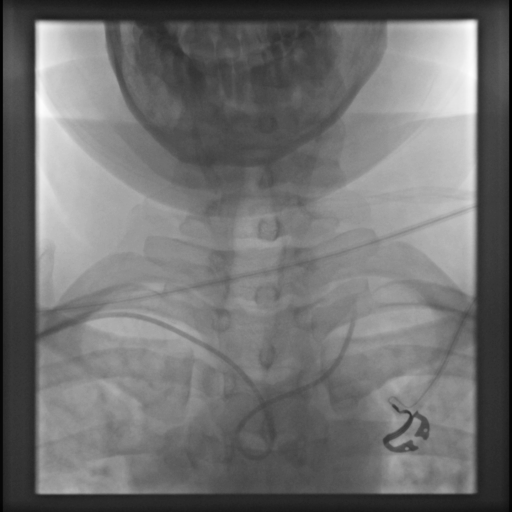

[Series 10: carotid care 2 · 4 acquisitions, 1 frame shown (1 of 3)]
[im 1/4]
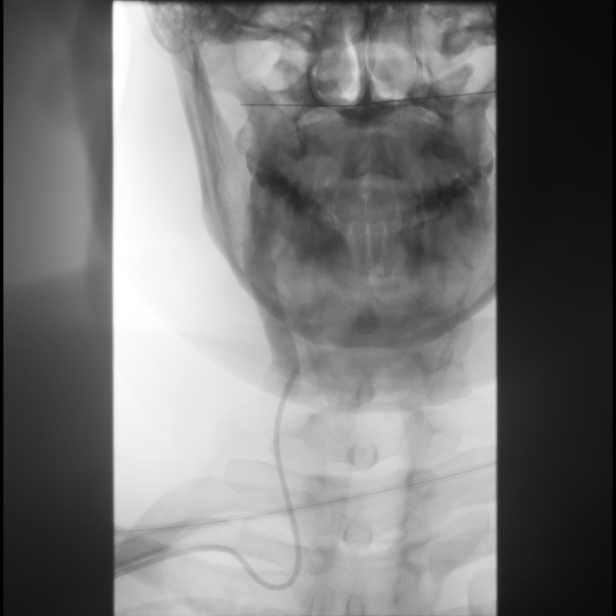

[Series 12: carotid care 2 · 4 acquisitions, 1 frame shown (2 of 3)]
[im 1/4]
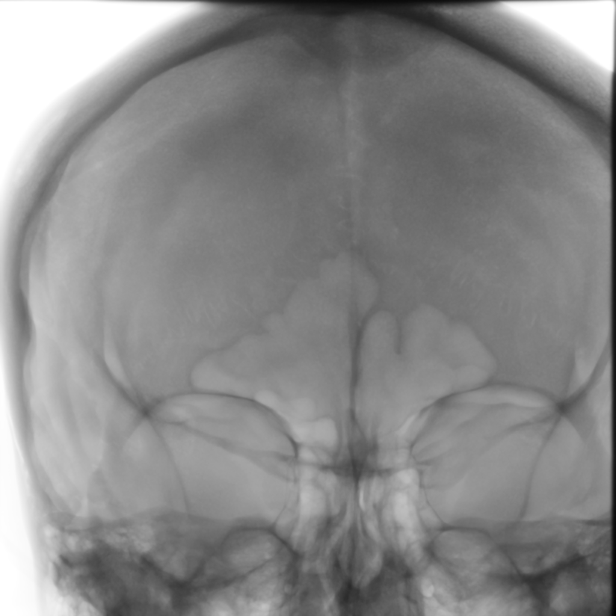

[Series 13: n roadmap · 1 of 308 frames shown]
[frame 155/308]
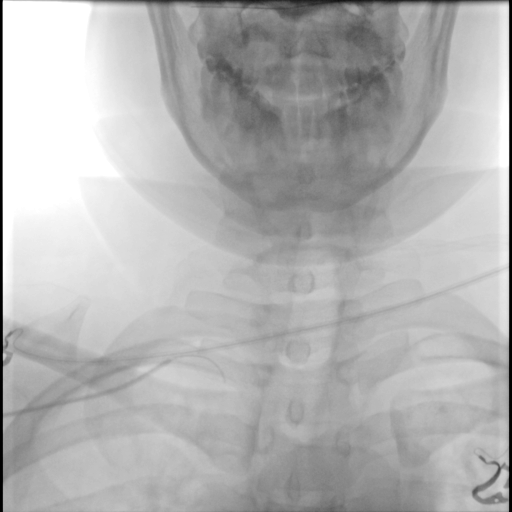

[Series 15: fl neuro n · 2 acquisitions, 1 frame shown (1 of 2)]
[im 1/2]
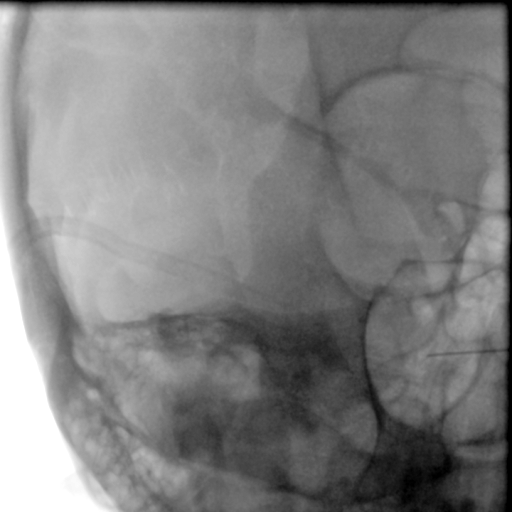

[Series 16: carotid care 2 · 4 acquisitions, 1 frame shown (3 of 3)]
[im 1/4]
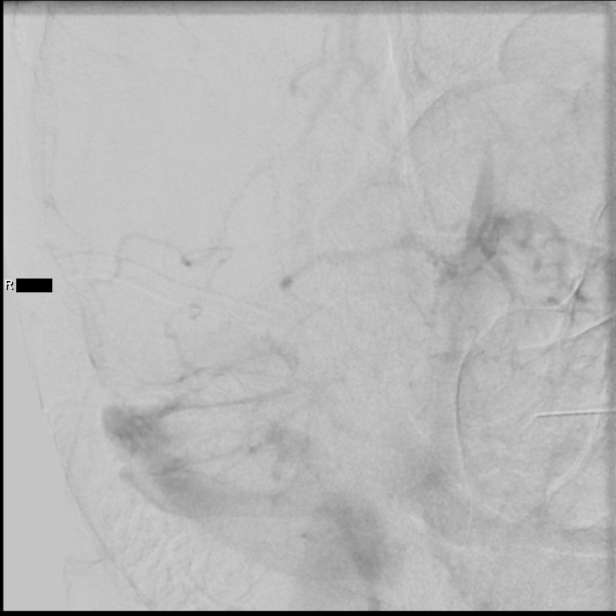

[Series 18: fl neuro n · 2 acquisitions, 1 frame shown (2 of 2)]
[im 1/2]
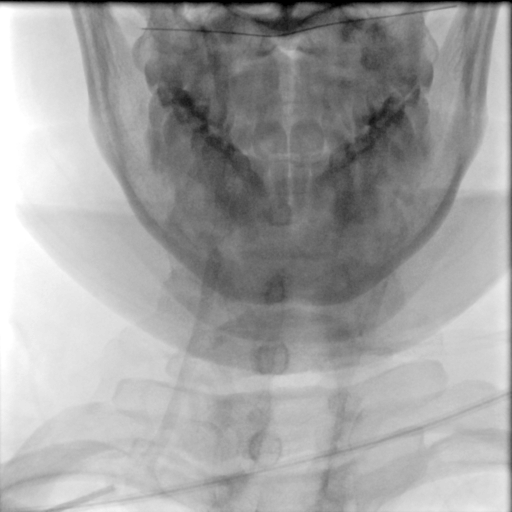

[Series 300: dr. (person_name) · 1 of 26 slices shown]
[im 26/26]
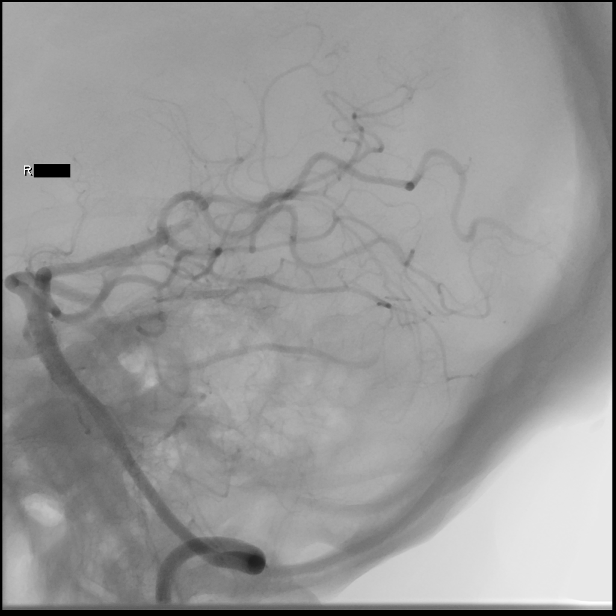

[12 of 24 positions shown; findings below may reference images not displayed]

MEDICATIONS:
300 mcg nitroglycerin, 5,000 units heparin, 5 mg verapamil
intra-arterially to the right radial artery.

ANESTHESIA/SEDATION:
Versed 1 mg IV; Fentanyl 25 mcg IV.

200 mcg of nitroglycerin; 1 mL of lidocaine 1% subcutaneous.

Moderate Sedation Time:  58 minutes.

The patient was continuously monitored during the procedure by the
interventional radiology nurse under my direct supervision.

CONTRAST:  65 mL of Omnipaque 240 milligram/mL

FLUOROSCOPY TIME:  Fluoroscopy Time: 14 minutes 12 seconds (767
mGy).

COMPLICATIONS:
SIR LEVEL B - Normal therapy, includes overnight admission for
observation.
Maximal Sterile Barrier Technique was utilized including caps, mask,
sterile gowns, sterile gloves, sterile drape, hand hygiene and skin
antiseptic. A timeout was performed prior to the initiation of the
procedure.

Using the modified Seldinger technique and a micropuncture kit,
access was gained to the distal right radial artery at the
anatomical snuffbox and a 5 French sheath was placed. Slow intra
arterial infusion of 5,000 BELKADI heparin, 5 mg Verapamil and 200 mcg
nitroglicerin diluted in patient's own blood was performed. No
significant fluctuation in patient's blood pressure seen. Then, a
right radial artery roadmap was obtained via sheath side port.
Normal brachial artery branching pattern seen. No significant
anatomical variation. The right radial artery caliber is adequate
for vascular access.

Next, a 5 Agbebaku Barry 2 glide catheter was navigated over a
0.035" Terumo Glidewire into the right subclavian artery under
fluoroscopic guidance. The catheter was then advanced into the
descending aortic arch where the catheter tip was reformed. The
catheter was then placed into the left common carotid artery.
Frontal and lateral angiograms of the neck were obtained. Using
biplane roadmap, the catheter was placed into the left internal
carotid artery. Frontal and lateral angiograms of the head were
obtained. The catheter was retracted into the left common carotid
artery and under biplane roadmap advanced into the left external
carotid artery. Frontal and lateral angiograms of the head were
obtained.

Next, the catheter was placed into the left vertebral artery.
Frontal and lateral angiograms of the head were obtained.

Subsequently, the catheter was placed into the right common carotid
artery. Frontal and lateral angiograms of the neck were obtained.
Under biplane roadmap, the catheter was placed into the right
internal carotid artery. Frontal and lateral angiograms of the head
were obtained. The catheter was then retracted into the right common
carotid artery and then advanced into the right external carotid
artery. Frontal and lateral angiograms of the head were obtained.

The catheter was then placed in the right vertebral artery. Frontal
lateral angiograms were obtained followed by multiple magnified
oblique views, centered on the right occipital lobe.

The catheter was subsequently withdrawn.

An inflatable band was placed and inflated over the right hand
access site. The vascular sheath was withdrawn and the band was
slowly deflated until brisk flow was noted through the arteriotomy
site. At this point, the band was reinflated with additional 2 cc of
air to obtain patent hemostasis.
FINDINGS: Right radial artery ultrasound and right radial artery angiogram:
The caliber of the distal right radial artery is appropriate for
angiogram access. The right radial artery and the right ulnar artery
have normal course and caliber. No significant anatomical variants
noted.

Left CCA angiograms: Cervical angiograms show normal course and
caliber of the visualized left common carotid and internal carotid
arteries. There are no significant stenoses.

Left ICA angiograms: There is brisk vascular contrast filling of the
the ACA and MCA vascular trees. Luminal caliber is smooth and
tapering. No aneurysms or abnormally high-flow, early draining veins
are seen. No regions of abnormal hypervascularity are noted. The
visualized dural sinuses are patent.

Left ECA angiograms: No early venous drainage was noted. The
intracranial branches of the left external carotid artery are
unremarkable.

Left vertebral artery angiograms: Arteriovenous malformation in the
right occipital lobe supplied by both calcarine and parietooccipital
branches of the right PCA and draining into a single cortical vein.
A 2 mm venous aneurysm is seen. No intra nidal aneurysm identified.
Nidus measures approximately 1.5 x 0.5 cm. Otherwise, the left
vertebral artery, basilar artery, and bilateral posterior cerebral
arteries are unremarkable. Luminal caliber is smooth and tapering.
No aneurysms are seen. The visualized dural sinuses are patent.

Right CCA angiograms: Cervical angiograms show normal course and
caliber of the visualized right common carotid and internal carotid
arteries. There are no significant stenoses.

Right ICA angiograms: There is brisk vascular contrast filling of
the ACA and MCA vascular trees. Luminal caliber is smooth and
tapering. No aneurysms or abnormally high-flow, early draining veins
are seen. No regions of abnormal hypervascularity are noted. The
visualized dural sinuses are patent.

Right ECA and right occipital angiograms: No early venous drainage
was noted. The intracranial branches of the right external carotid
artery are unremarkable.

Right vertebral artery angiograms: Again demonstrated arteriovenous
malformation in the right occipital lobe supplied by both calcarine
and parietooccipital branches of the right PCA and draining into a
single cortical vein. A 2 mm venous aneurysm is seen. No intra nidal
aneurysm identified. Nidus measures approximately 1.5 x 0.5 cm.
Otherwise, the right vertebral artery, basilar artery, and bilateral
posterior cerebral arteries are unremarkable. Luminal caliber is
smooth and tapering. No aneurysms are seen. No regions of abnormal
hypervascularity are noted. The visualized dural sinuses are patent.

PROCEDURE:
No intervention performed.
IMPRESSION: A 1.5 cm right occipital lobe arteriovenous malformation supplied by
the right PCA branches and draining into a single cortical vein
(Latha Rosado grade 2). No other significant vascular
abnormality.

PLAN:
Follow-up consult within 2 weeks to discuss management.

## 2021-12-09 IMAGING — CT CT HEAD CODE STROKE
3 series · 16 of 47 positions shown, 19 images · non-contrast
Comparison: CT head 03/14/2021

CLINICAL DATA: Code stroke. Headache. AVM right occipital lobe.
Catheter angiogram today.

EXAM:
CT HEAD WITHOUT CONTRAST
TECHNIQUE: Contiguous axial images were obtained from the base of the skull
through the vertex without intravenous contrast.

[Series 3: head 5.0 h30s · axial · 0.45mm/px · z∈[+282,+427]mm · 10 of 35 slices shown, 13 images]
[im 3/35  brain]
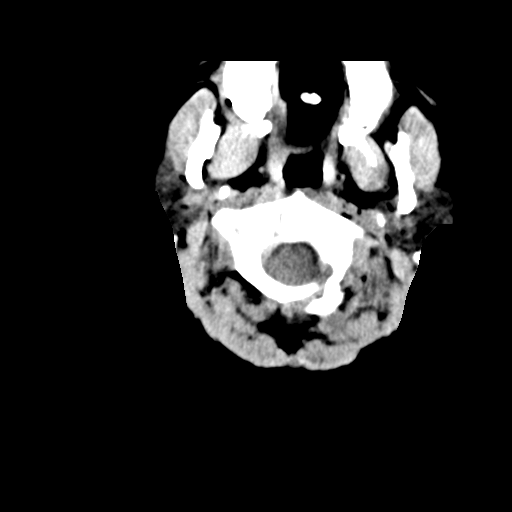
[im 3/35  bone]
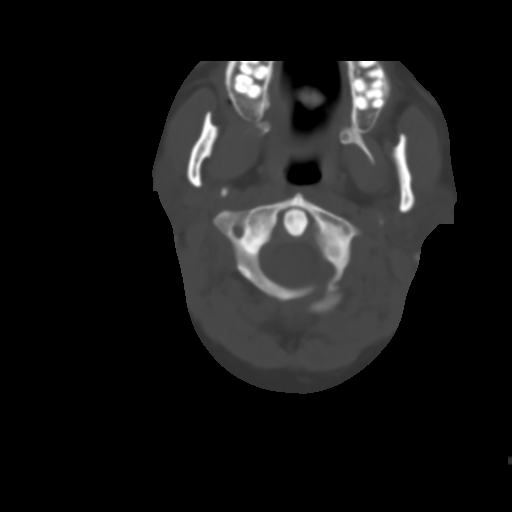
[im 6/35  brain]
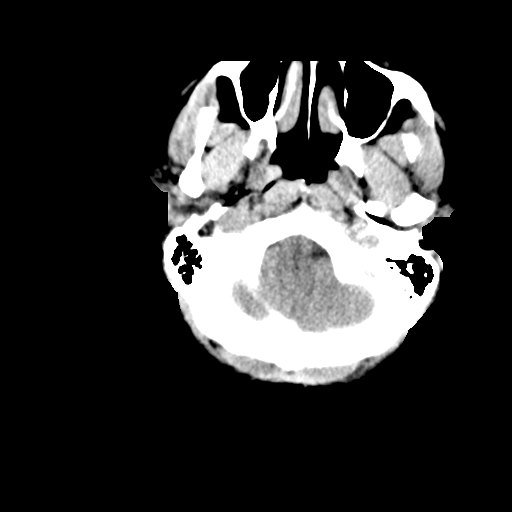
[im 10/35  brain]
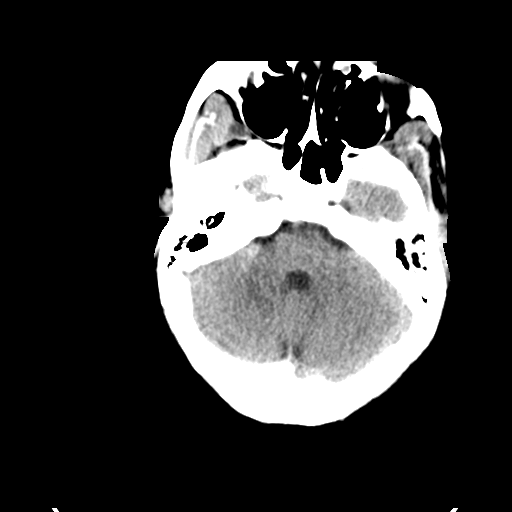
[im 12/35  brain]
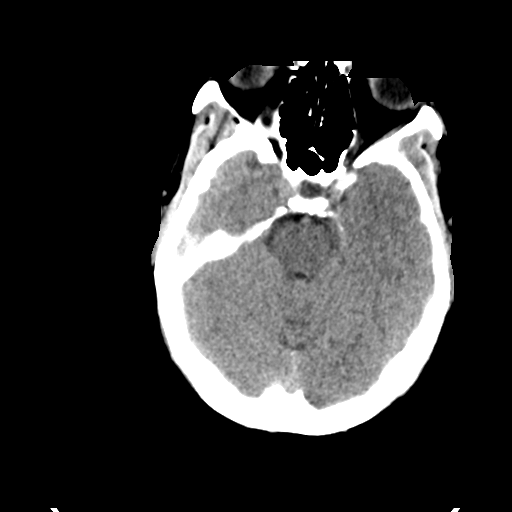
[im 16/35  brain]
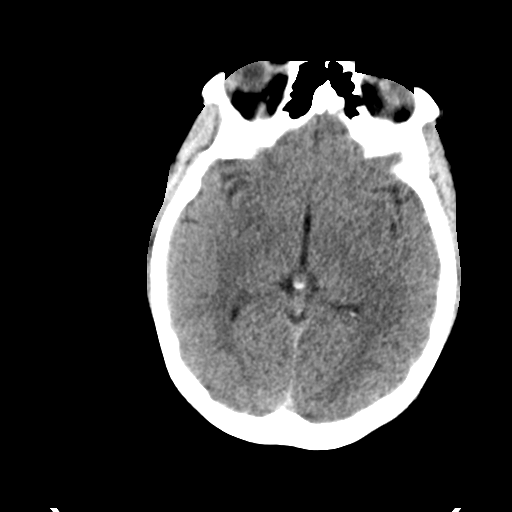
[im 16/35  bone]
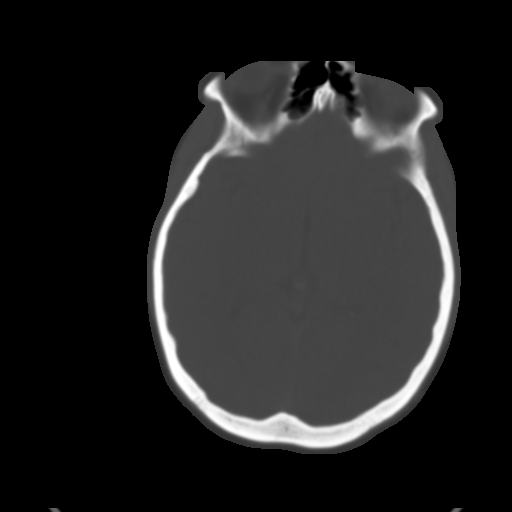
[im 19/35  brain]
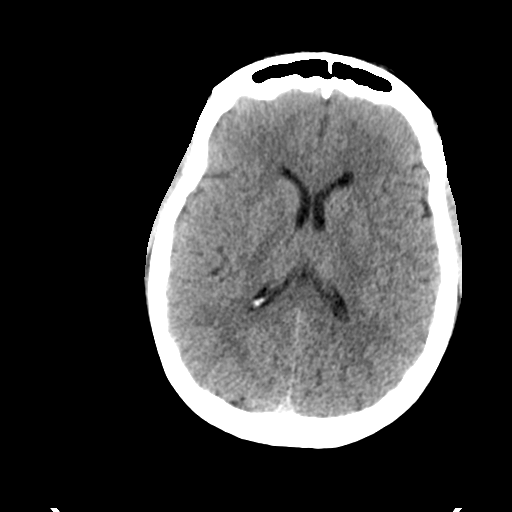
[im 23/35  brain]
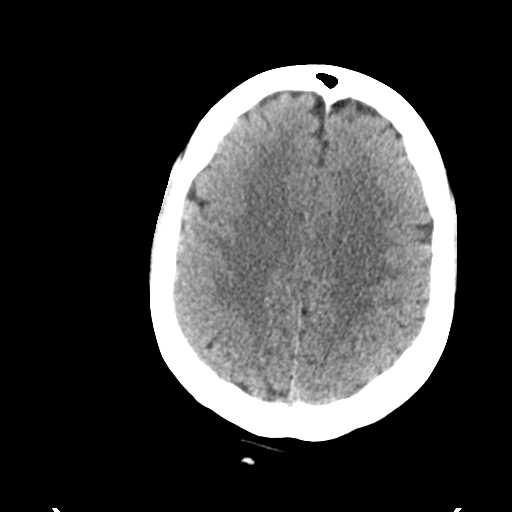
[im 26/35  brain]
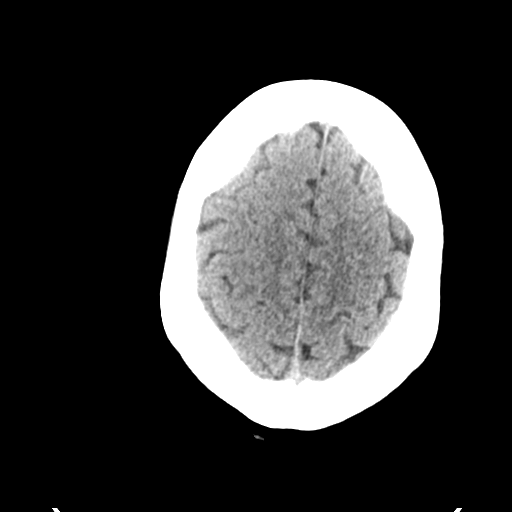
[im 29/35  brain]
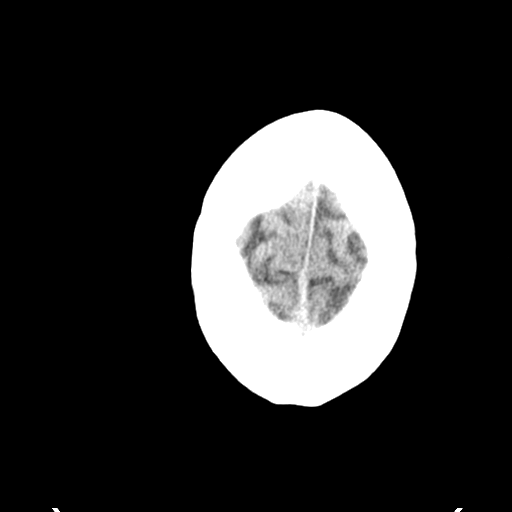
[im 29/35  bone]
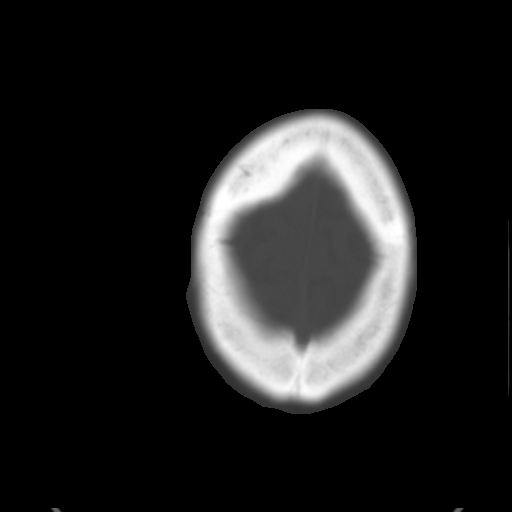
[im 32/35  brain]
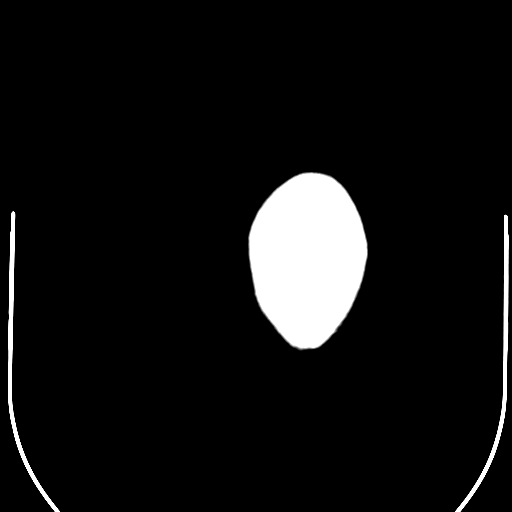

[Series 5: head 3.0 mpr cor · coronal · 0.34mm/px · 3 of 67 slices shown]
[im 23/67  brain]
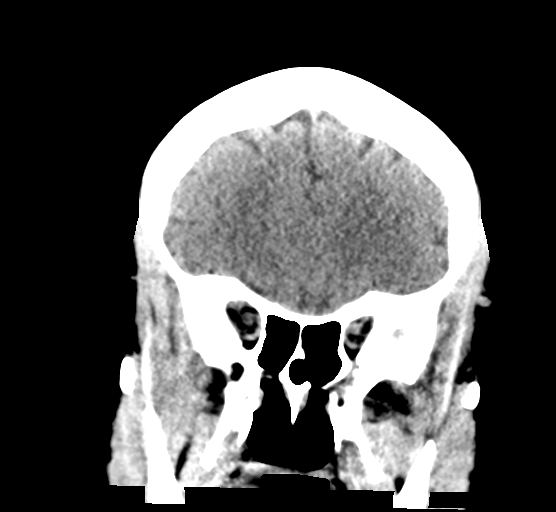
[im 30/67  brain]
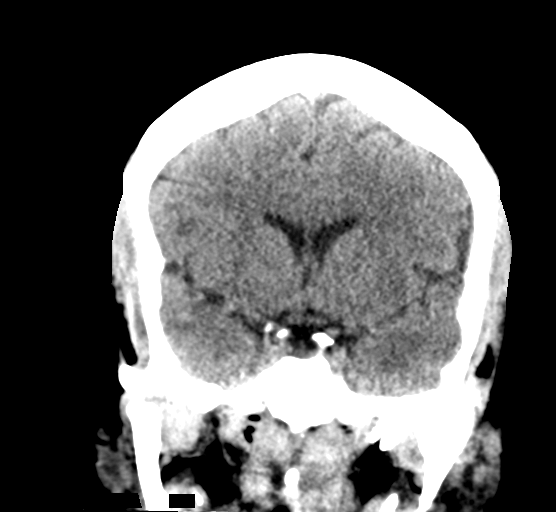
[im 37/67  brain]
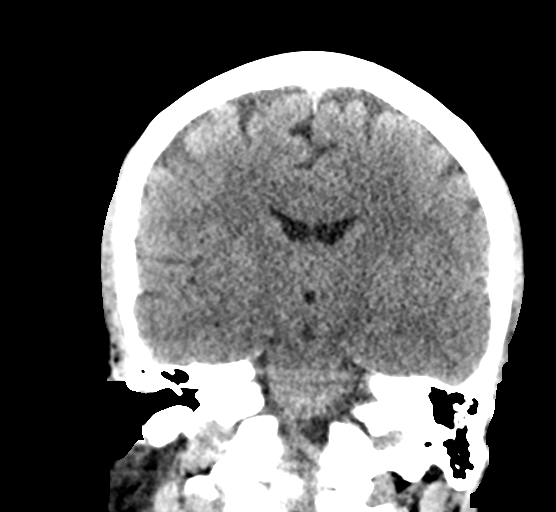

[Series 6: head 3.0 mpr sag · sagittal · 0.34mm/px · 3 of 67 slices shown]
[im 23/67  brain]
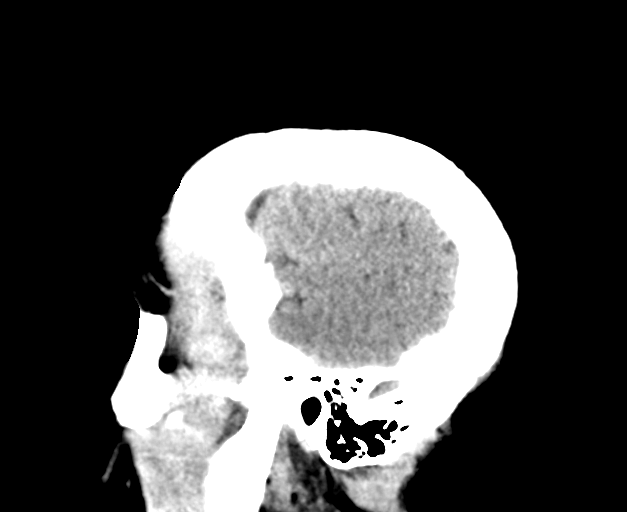
[im 34/67  brain]
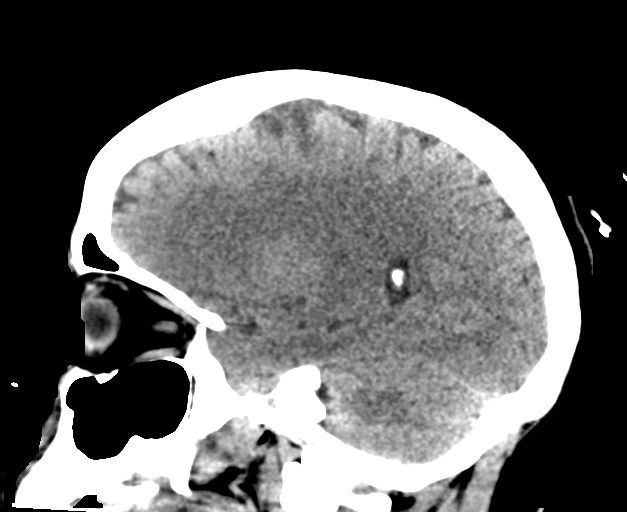
[im 45/67  brain]
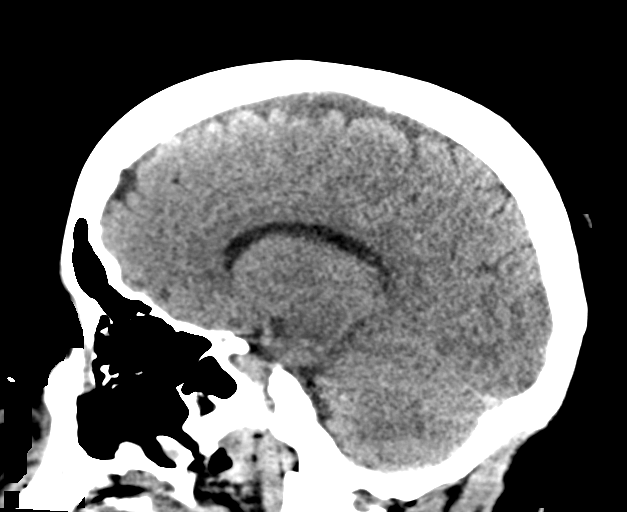

[16 of 47 positions shown; findings below may reference images not displayed]

FINDINGS: Brain: Chiari malformation. Cerebellar tonsils below the foramen
magnum, unchanged from prior studies. Negative for hydrocephalus.

Negative for acute infarct, hemorrhage, mass.

Vascular: Negative for hyperdense vessel

Skull: Negative

Sinuses/Orbits: Paranasal sinuses clear.  Normal orbit

Other: None

ASPECTS (Alberta Stroke Program Early CT Score)

- Ganglionic level infarction (caudate, lentiform nuclei, internal
capsule, insula, M1-M3 cortex): 7

- Supraganglionic infarction (M4-M6 cortex): 3

Total score (0-10 with 10 being normal): 10
IMPRESSION: 1. No acute abnormality
2. Chiari malformation
3. ASPECTS is 10
4. Code stroke imaging results were communicated on 04/05/2021 at
[DATE] to provider Hebba via text page
